# Patient Record
Sex: Female | Born: 1970 | Race: White | Hispanic: No | Marital: Married | Smoking: Former smoker
Health system: Southern US, Community
[De-identification: ages and names within clinical notes are randomized; demographics above are authoritative.]

## PROBLEM LIST (undated history)

## (undated) DIAGNOSIS — J45909 Unspecified asthma, uncomplicated: Secondary | ICD-10-CM

## (undated) DIAGNOSIS — L255 Unspecified contact dermatitis due to plants, except food: Secondary | ICD-10-CM

## (undated) DIAGNOSIS — J309 Allergic rhinitis, unspecified: Secondary | ICD-10-CM

## (undated) DIAGNOSIS — F32A Depression, unspecified: Secondary | ICD-10-CM

## (undated) DIAGNOSIS — G43909 Migraine, unspecified, not intractable, without status migrainosus: Secondary | ICD-10-CM

## (undated) DIAGNOSIS — R4589 Other symptoms and signs involving emotional state: Secondary | ICD-10-CM

## (undated) DIAGNOSIS — K219 Gastro-esophageal reflux disease without esophagitis: Secondary | ICD-10-CM

## (undated) DIAGNOSIS — J329 Chronic sinusitis, unspecified: Secondary | ICD-10-CM

## (undated) DIAGNOSIS — F329 Major depressive disorder, single episode, unspecified: Secondary | ICD-10-CM

## (undated) DIAGNOSIS — K3184 Gastroparesis: Secondary | ICD-10-CM

## (undated) DIAGNOSIS — K589 Irritable bowel syndrome without diarrhea: Secondary | ICD-10-CM

## (undated) DIAGNOSIS — R5383 Other fatigue: Secondary | ICD-10-CM

## (undated) DIAGNOSIS — D649 Anemia, unspecified: Secondary | ICD-10-CM

## (undated) DIAGNOSIS — Z87448 Personal history of other diseases of urinary system: Secondary | ICD-10-CM

## (undated) DIAGNOSIS — N390 Urinary tract infection, site not specified: Secondary | ICD-10-CM

## (undated) DIAGNOSIS — K5790 Diverticulosis of intestine, part unspecified, without perforation or abscess without bleeding: Secondary | ICD-10-CM

## (undated) DIAGNOSIS — K5900 Constipation, unspecified: Secondary | ICD-10-CM

## (undated) DIAGNOSIS — E669 Obesity, unspecified: Secondary | ICD-10-CM

## (undated) DIAGNOSIS — R5381 Other malaise: Secondary | ICD-10-CM

## (undated) DIAGNOSIS — B019 Varicella without complication: Secondary | ICD-10-CM

## (undated) DIAGNOSIS — E538 Deficiency of other specified B group vitamins: Secondary | ICD-10-CM

## (undated) HISTORY — DX: Unspecified contact dermatitis due to plants, except food: L25.5

## (undated) HISTORY — DX: Irritable bowel syndrome, unspecified: K58.9

## (undated) HISTORY — DX: Major depressive disorder, single episode, unspecified: F32.9

## (undated) HISTORY — DX: Anemia, unspecified: D64.9

## (undated) HISTORY — DX: Varicella without complication: B01.9

## (undated) HISTORY — DX: Constipation, unspecified: K59.00

## (undated) HISTORY — DX: Gastroparesis: K31.84

## (undated) HISTORY — DX: Other malaise: R53.81

## (undated) HISTORY — DX: Other fatigue: R53.83

## (undated) HISTORY — DX: Chronic sinusitis, unspecified: J32.9

## (undated) HISTORY — DX: Personal history of other diseases of urinary system: Z87.448

## (undated) HISTORY — DX: Other symptoms and signs involving emotional state: R45.89

## (undated) HISTORY — DX: Migraine, unspecified, not intractable, without status migrainosus: G43.909

## (undated) HISTORY — DX: Obesity, unspecified: E66.9

## (undated) HISTORY — DX: Urinary tract infection, site not specified: N39.0

## (undated) HISTORY — DX: Diverticulosis of intestine, part unspecified, without perforation or abscess without bleeding: K57.90

## (undated) HISTORY — DX: Depression, unspecified: F32.A

## (undated) HISTORY — DX: Gastro-esophageal reflux disease without esophagitis: K21.9

## (undated) HISTORY — DX: Deficiency of other specified B group vitamins: E53.8

## (undated) HISTORY — DX: Allergic rhinitis, unspecified: J30.9

## (undated) HISTORY — DX: Unspecified asthma, uncomplicated: J45.909

## (undated) HISTORY — PX: TONSILLECTOMY: SUR1361

---

## 1998-12-08 ENCOUNTER — Emergency Department (HOSPITAL_COMMUNITY): Admission: EM | Admit: 1998-12-08 | Discharge: 1998-12-08 | Payer: Self-pay | Admitting: Emergency Medicine

## 1999-05-17 ENCOUNTER — Other Ambulatory Visit: Admission: RE | Admit: 1999-05-17 | Discharge: 1999-05-17 | Payer: Self-pay | Admitting: Obstetrics and Gynecology

## 2000-01-13 ENCOUNTER — Inpatient Hospital Stay (HOSPITAL_COMMUNITY): Admission: AD | Admit: 2000-01-13 | Discharge: 2000-01-13 | Payer: Self-pay | Admitting: *Deleted

## 2000-01-21 ENCOUNTER — Inpatient Hospital Stay (HOSPITAL_COMMUNITY): Admission: AD | Admit: 2000-01-21 | Discharge: 2000-01-21 | Payer: Self-pay | Admitting: Obstetrics and Gynecology

## 2000-08-14 ENCOUNTER — Inpatient Hospital Stay (HOSPITAL_COMMUNITY): Admission: AD | Admit: 2000-08-14 | Discharge: 2000-08-16 | Payer: Self-pay | Admitting: Obstetrics and Gynecology

## 2000-09-19 ENCOUNTER — Other Ambulatory Visit: Admission: RE | Admit: 2000-09-19 | Discharge: 2000-09-19 | Payer: Self-pay | Admitting: Obstetrics and Gynecology

## 2001-09-24 ENCOUNTER — Other Ambulatory Visit: Admission: RE | Admit: 2001-09-24 | Discharge: 2001-09-24 | Payer: Self-pay | Admitting: Obstetrics and Gynecology

## 2002-10-17 ENCOUNTER — Other Ambulatory Visit: Admission: RE | Admit: 2002-10-17 | Discharge: 2002-10-17 | Payer: Self-pay | Admitting: Obstetrics and Gynecology

## 2003-06-06 HISTORY — PX: LUMBAR DISC SURGERY: SHX700

## 2003-07-01 ENCOUNTER — Ambulatory Visit (HOSPITAL_COMMUNITY): Admission: RE | Admit: 2003-07-01 | Discharge: 2003-07-02 | Payer: Self-pay | Admitting: Orthopedic Surgery

## 2003-11-04 ENCOUNTER — Other Ambulatory Visit: Admission: RE | Admit: 2003-11-04 | Discharge: 2003-11-04 | Payer: Self-pay | Admitting: Obstetrics and Gynecology

## 2005-02-04 ENCOUNTER — Encounter (INDEPENDENT_AMBULATORY_CARE_PROVIDER_SITE_OTHER): Payer: Self-pay | Admitting: Specialist

## 2005-02-04 ENCOUNTER — Ambulatory Visit (HOSPITAL_COMMUNITY): Admission: RE | Admit: 2005-02-04 | Discharge: 2005-02-04 | Payer: Self-pay | Admitting: Obstetrics and Gynecology

## 2005-04-12 ENCOUNTER — Ambulatory Visit: Payer: Self-pay | Admitting: Family Medicine

## 2006-03-14 ENCOUNTER — Ambulatory Visit: Payer: Self-pay | Admitting: Family Medicine

## 2006-07-26 ENCOUNTER — Ambulatory Visit: Payer: Self-pay | Admitting: Family Medicine

## 2006-09-08 ENCOUNTER — Ambulatory Visit: Payer: Self-pay | Admitting: Internal Medicine

## 2006-09-27 ENCOUNTER — Ambulatory Visit: Payer: Self-pay | Admitting: Family Medicine

## 2006-09-29 ENCOUNTER — Encounter: Admission: RE | Admit: 2006-09-29 | Discharge: 2006-09-29 | Payer: Self-pay | Admitting: Family Medicine

## 2008-01-31 ENCOUNTER — Ambulatory Visit: Payer: Self-pay | Admitting: Family Medicine

## 2008-01-31 DIAGNOSIS — L255 Unspecified contact dermatitis due to plants, except food: Secondary | ICD-10-CM | POA: Insufficient documentation

## 2008-02-13 ENCOUNTER — Ambulatory Visit: Payer: Self-pay | Admitting: Family Medicine

## 2008-02-13 DIAGNOSIS — J309 Allergic rhinitis, unspecified: Secondary | ICD-10-CM | POA: Insufficient documentation

## 2008-02-13 DIAGNOSIS — K589 Irritable bowel syndrome without diarrhea: Secondary | ICD-10-CM | POA: Insufficient documentation

## 2008-02-13 DIAGNOSIS — Z87448 Personal history of other diseases of urinary system: Secondary | ICD-10-CM | POA: Insufficient documentation

## 2008-02-13 DIAGNOSIS — J45909 Unspecified asthma, uncomplicated: Secondary | ICD-10-CM | POA: Insufficient documentation

## 2008-02-13 DIAGNOSIS — J329 Chronic sinusitis, unspecified: Secondary | ICD-10-CM | POA: Insufficient documentation

## 2008-05-26 ENCOUNTER — Encounter: Payer: Self-pay | Admitting: Family Medicine

## 2008-06-09 ENCOUNTER — Encounter: Payer: Self-pay | Admitting: Family Medicine

## 2008-06-10 ENCOUNTER — Encounter: Payer: Self-pay | Admitting: Family Medicine

## 2008-08-21 ENCOUNTER — Ambulatory Visit: Payer: Self-pay | Admitting: Family Medicine

## 2008-08-21 LAB — CONVERTED CEMR LAB: Rapid Strep: NEGATIVE

## 2008-09-30 ENCOUNTER — Encounter: Payer: Self-pay | Admitting: Family Medicine

## 2008-11-03 HISTORY — PX: INCONTINENCE SURGERY: SHX676

## 2008-11-05 ENCOUNTER — Encounter: Payer: Self-pay | Admitting: Family Medicine

## 2008-11-27 ENCOUNTER — Ambulatory Visit (HOSPITAL_BASED_OUTPATIENT_CLINIC_OR_DEPARTMENT_OTHER): Admission: RE | Admit: 2008-11-27 | Discharge: 2008-11-28 | Payer: Self-pay | Admitting: Urology

## 2009-05-07 ENCOUNTER — Encounter: Payer: Self-pay | Admitting: Family Medicine

## 2009-05-08 ENCOUNTER — Ambulatory Visit: Payer: Self-pay | Admitting: Family Medicine

## 2009-05-08 DIAGNOSIS — R5383 Other fatigue: Secondary | ICD-10-CM

## 2009-05-08 DIAGNOSIS — R5381 Other malaise: Secondary | ICD-10-CM | POA: Insufficient documentation

## 2009-05-08 DIAGNOSIS — K219 Gastro-esophageal reflux disease without esophagitis: Secondary | ICD-10-CM | POA: Insufficient documentation

## 2009-05-08 DIAGNOSIS — R4589 Other symptoms and signs involving emotional state: Secondary | ICD-10-CM | POA: Insufficient documentation

## 2009-05-12 LAB — CONVERTED CEMR LAB
ALT: 16 units/L (ref 0–35)
AST: 23 units/L (ref 0–37)
Albumin: 3.9 g/dL (ref 3.5–5.2)
Alkaline Phosphatase: 78 units/L (ref 39–117)
BUN: 10 mg/dL (ref 6–23)
Basophils Absolute: 0 10*3/uL (ref 0.0–0.1)
Basophils Relative: 0.2 % (ref 0.0–3.0)
Bilirubin, Direct: 0.1 mg/dL (ref 0.0–0.3)
CO2: 27 meq/L (ref 19–32)
Calcium: 8.9 mg/dL (ref 8.4–10.5)
Chloride: 107 meq/L (ref 96–112)
Cholesterol: 122 mg/dL (ref 0–200)
Creatinine, Ser: 0.7 mg/dL (ref 0.4–1.2)
Eosinophils Absolute: 0.2 10*3/uL (ref 0.0–0.7)
Eosinophils Relative: 2.9 % (ref 0.0–5.0)
Folate: 6.8 ng/mL
GFR calc non Af Amer: 98.57 mL/min (ref >60)
Glucose, Bld: 99 mg/dL (ref 70–99)
HCT: 37.2 % (ref 36.0–46.0)
HDL: 39.1 mg/dL (ref >39.00)
Hemoglobin: 12.5 g/dL (ref 12.0–15.0)
LDL Cholesterol: 71 mg/dL (ref 0–99)
Lymphocytes Relative: 21 % (ref 12.0–46.0)
Lymphs Abs: 1.4 10*3/uL (ref 0.7–4.0)
MCHC: 33.6 g/dL (ref 30.0–36.0)
MCV: 86.2 fL (ref 78.0–100.0)
Monocytes Absolute: 0.7 10*3/uL (ref 0.1–1.0)
Monocytes Relative: 10.8 % (ref 3.0–12.0)
Neutro Abs: 4.5 10*3/uL (ref 1.4–7.7)
Neutrophils Relative %: 65.1 % (ref 43.0–77.0)
Platelets: 310 10*3/uL (ref 150.0–400.0)
Potassium: 4 meq/L (ref 3.5–5.1)
Pro B Natriuretic peptide (BNP): 33 pg/mL (ref 0.0–100.0)
RBC: 4.32 M/uL (ref 3.87–5.11)
RDW: 13.4 % (ref 11.5–14.6)
Sodium: 140 meq/L (ref 135–145)
TSH: 1.04 microintl units/mL (ref 0.35–5.50)
Total Bilirubin: 0.8 mg/dL (ref 0.3–1.2)
Total CHOL/HDL Ratio: 3
Total Protein: 7 g/dL (ref 6.0–8.3)
Triglycerides: 58 mg/dL (ref 0.0–149.0)
VLDL: 11.6 mg/dL (ref 0.0–40.0)
Vitamin B-12: 185 pg/mL — ABNORMAL LOW (ref 211–911)
WBC: 6.8 10*3/uL (ref 4.5–10.5)

## 2009-05-15 ENCOUNTER — Ambulatory Visit: Payer: Self-pay | Admitting: Family Medicine

## 2009-05-15 DIAGNOSIS — E538 Deficiency of other specified B group vitamins: Secondary | ICD-10-CM | POA: Insufficient documentation

## 2009-05-22 ENCOUNTER — Ambulatory Visit: Payer: Self-pay | Admitting: Family Medicine

## 2009-05-29 ENCOUNTER — Ambulatory Visit: Payer: Self-pay | Admitting: Family Medicine

## 2009-06-05 ENCOUNTER — Ambulatory Visit: Payer: Self-pay | Admitting: Family Medicine

## 2009-06-25 ENCOUNTER — Ambulatory Visit: Payer: Self-pay | Admitting: Family Medicine

## 2009-06-25 ENCOUNTER — Telehealth: Payer: Self-pay | Admitting: Family Medicine

## 2009-07-01 ENCOUNTER — Telehealth: Payer: Self-pay | Admitting: Family Medicine

## 2009-07-01 ENCOUNTER — Ambulatory Visit: Payer: Self-pay | Admitting: Family Medicine

## 2009-07-01 DIAGNOSIS — N390 Urinary tract infection, site not specified: Secondary | ICD-10-CM | POA: Insufficient documentation

## 2009-07-01 LAB — CONVERTED CEMR LAB
Bilirubin Urine: NEGATIVE
Glucose, Urine, Semiquant: NEGATIVE
Ketones, urine, test strip: NEGATIVE
Nitrite: NEGATIVE
RBC / HPF: 0
Specific Gravity, Urine: 1.01
Urobilinogen, UA: 0.2
Vitamin B-12: 396 pg/mL (ref 211–911)
pH: 6.5

## 2009-07-10 ENCOUNTER — Ambulatory Visit: Payer: Self-pay | Admitting: Family Medicine

## 2009-08-07 ENCOUNTER — Ambulatory Visit: Payer: Self-pay | Admitting: Family Medicine

## 2009-09-02 ENCOUNTER — Ambulatory Visit: Payer: Self-pay | Admitting: Gastroenterology

## 2009-09-03 ENCOUNTER — Ambulatory Visit: Payer: Self-pay | Admitting: Gastroenterology

## 2009-09-11 ENCOUNTER — Ambulatory Visit: Payer: Self-pay | Admitting: Family Medicine

## 2009-10-02 ENCOUNTER — Ambulatory Visit: Payer: Self-pay | Admitting: Family Medicine

## 2009-10-03 LAB — CONVERTED CEMR LAB: Vitamin B-12: 677 pg/mL (ref 211–911)

## 2009-10-09 ENCOUNTER — Ambulatory Visit: Payer: Self-pay | Admitting: Family Medicine

## 2009-10-27 ENCOUNTER — Ambulatory Visit: Payer: Self-pay | Admitting: Gastroenterology

## 2009-11-06 ENCOUNTER — Ambulatory Visit: Payer: Self-pay | Admitting: Family Medicine

## 2009-11-09 ENCOUNTER — Ambulatory Visit (HOSPITAL_COMMUNITY): Admission: RE | Admit: 2009-11-09 | Discharge: 2009-11-09 | Payer: Self-pay | Admitting: Gastroenterology

## 2009-11-10 ENCOUNTER — Ambulatory Visit: Payer: Self-pay | Admitting: Family Medicine

## 2009-11-11 ENCOUNTER — Telehealth (INDEPENDENT_AMBULATORY_CARE_PROVIDER_SITE_OTHER): Payer: Self-pay | Admitting: *Deleted

## 2009-11-11 ENCOUNTER — Encounter: Payer: Self-pay | Admitting: Gastroenterology

## 2009-11-25 ENCOUNTER — Ambulatory Visit: Payer: Self-pay | Admitting: Gastroenterology

## 2009-11-25 DIAGNOSIS — K3184 Gastroparesis: Secondary | ICD-10-CM | POA: Insufficient documentation

## 2009-12-07 ENCOUNTER — Telehealth: Payer: Self-pay | Admitting: Gastroenterology

## 2009-12-08 ENCOUNTER — Telehealth (INDEPENDENT_AMBULATORY_CARE_PROVIDER_SITE_OTHER): Payer: Self-pay | Admitting: *Deleted

## 2009-12-11 ENCOUNTER — Ambulatory Visit: Payer: Self-pay | Admitting: Family Medicine

## 2009-12-14 ENCOUNTER — Encounter: Payer: Self-pay | Admitting: Gastroenterology

## 2010-01-04 ENCOUNTER — Encounter: Admission: RE | Admit: 2010-01-04 | Discharge: 2010-01-04 | Payer: Self-pay | Admitting: Obstetrics and Gynecology

## 2010-01-06 ENCOUNTER — Ambulatory Visit: Payer: Self-pay | Admitting: Gastroenterology

## 2010-01-07 ENCOUNTER — Telehealth: Payer: Self-pay | Admitting: Gastroenterology

## 2010-01-08 ENCOUNTER — Ambulatory Visit: Payer: Self-pay | Admitting: Family Medicine

## 2010-02-25 ENCOUNTER — Ambulatory Visit: Payer: Self-pay | Admitting: Family Medicine

## 2010-03-01 ENCOUNTER — Ambulatory Visit: Payer: Self-pay | Admitting: Gastroenterology

## 2010-03-12 ENCOUNTER — Encounter (INDEPENDENT_AMBULATORY_CARE_PROVIDER_SITE_OTHER): Payer: Self-pay | Admitting: *Deleted

## 2010-03-26 ENCOUNTER — Ambulatory Visit: Payer: Self-pay | Admitting: Family Medicine

## 2010-04-30 ENCOUNTER — Ambulatory Visit: Payer: Self-pay | Admitting: Family Medicine

## 2010-06-07 ENCOUNTER — Ambulatory Visit: Payer: Self-pay | Admitting: Gastroenterology

## 2010-06-07 DIAGNOSIS — K5900 Constipation, unspecified: Secondary | ICD-10-CM | POA: Insufficient documentation

## 2010-09-26 ENCOUNTER — Encounter: Payer: Self-pay | Admitting: Family Medicine

## 2010-10-05 NOTE — Assessment & Plan Note (Signed)
Summary: TOWER B12/RBH   Nurse Visit   Allergies: 1)  ! * Aleve  Medication Administration  Injection # 1:    Medication: Vit B12 1000 mcg    Diagnosis: VITAMIN B12 DEFICIENCY (ICD-266.2)    Route: IM    Site: L deltoid    Exp Date: 06/2011    Lot #: [id number]    Mfr: American Regent    Patient tolerated injection without complications    Given by: Lowella Petties CMA (September 11, 2009 9:31 AM)  Orders Added: 1)  Vit B12 1000 mcg [J3420] 2)  Admin of Therapeutic Inj  intramuscular or subcutaneous [[id number]]   Medication Administration  Injection # 1:    Medication: Vit B12 1000 mcg    Diagnosis: VITAMIN B12 DEFICIENCY (ICD-266.2)    Route: IM    Site: L deltoid    Exp Date: 06/2011    Lot #: [id number]    Mfr: American Regent    Patient tolerated injection without complications    Given by: Lowella Petties CMA (September 11, 2009 9:31 AM)  Orders Added: 1)  Vit B12 1000 mcg [J3420] 2)  Admin of Therapeutic Inj  intramuscular or subcutaneous [[id number]]

## 2010-10-05 NOTE — Assessment & Plan Note (Signed)
Summary: EGD F/U.Marland KitchenMarland KitchenAS.    History of Present Illness Visit Type: Follow-up Visit Primary GI MD: Melvia Heaps MD [facility] Primary Provider: Roxy Manns, MD Requesting Provider: n/a Chief Complaint: F/u from EGD History of Present Illness:   Holly Chung has returned for followup of her GI complaints.  Upper endoscopy in December, 2010 was unremarkable.  She takes Dexilant daily.  Her main complaint is postprandial fullness with excess eructations  and a sensation of globus.  This usually occurs within half a hour of eating and may last up to an hour.  She no longer has an acid-like sensation. She drinks a large amount of water throughout the day and during meals.  She has the sensation of wanting to clear her throat.   GI Review of Systems      Denies abdominal pain, acid reflux, belching, bloating, chest pain, dysphagia with liquids, dysphagia with solids, heartburn, loss of appetite, nausea, vomiting, vomiting blood, weight loss, and  weight gain.        Denies anal fissure, black tarry stools, change in bowel habit, constipation, diarrhea, diverticulosis, fecal incontinence, heme positive stool, hemorrhoids, irritable bowel syndrome, jaundice, light color stool, liver problems, rectal bleeding, and  rectal pain.    Current Medications (verified): 1)  Dexilant 60 Mg Cpdr (Dexlansoprazole) .Marland Kitchen.. 1 By Mouth Once Daily 2)  Alprazolam 0.5 Mg Tabs (Alprazolam) .Marland Kitchen.. 1 By Mouth Once Daily As Needed Severe Anxiety Caution- It Does Sedate 3)  Vitamin B Complex-C   Caps (B Complex-C) .... Otc As Directed. 4)  Vitamin B-12 500 Mcg  Tabs (Cyanocobalamin) .... Take Two Tabs Daily 5)  Cyanocobalamin 1000 Mcg/ml Soln (Cyanocobalamin) .... Monthly Injectiions  Allergies (verified): 1)  ! * Aleve  Past History:  Past Medical History: Reviewed history from 09/02/2009 and no changes required. IBS reactive airways recurrent sinusitis  obesity GERD Anemia Depression Diverticulosis  Past Surgical  History: Reviewed history from 09/02/2009 and no changes required. Discectomy L5, S1 (06/2003) 3/10 bladder sling/incontinence surgery urol-- Tannenbaum Tonsillectomy  Family History: Father: emphysema Mother:  Siblings: brother died age 86- brain tumor, brother died age 7- leukemia MGM with heart probs Family History of Heart Disease: Father Family History of Colon Cancer:Maternal Aunt   Social History: Reviewed history from 08/21/2008 and no changes required. Marital Status: Married Children: 3 Occupation: [employer] Former Smoker, > 10 years  Review of Systems  The patient denies allergy/sinus, anemia, anxiety-new, arthritis/joint pain, back pain, blood in urine, breast changes/lumps, change in vision, confusion, cough, coughing up blood, depression-new, fainting, fatigue, fever, headaches-new, hearing problems, heart murmur, heart rhythm changes, itching, menstrual pain, muscle pains/cramps, night sweats, nosebleeds, pregnancy symptoms, shortness of breath, skin rash, sleeping problems, sore throat, swelling of feet/legs, swollen lymph glands, thirst - excessive , urination - excessive , urination changes/pain, urine leakage, vision changes, and voice change.    Vital Signs:  Patient profile:   40 year old female Height:      67.5 inches Weight:      247 pounds BMI:     38.25 BSA:     2.23 Pulse rate:   68 / minute Pulse rhythm:   regular BP sitting:   124 / 80  (left arm) Cuff size:   regular  Vitals Entered By: Ok Anis CMA (October 27, 2009 1:53 PM)   Impression & Recommendations:  Problem # 1:  ESOPHAGEAL REFLUX (ICD-530.81)  At this point her symptoms may be more dyspeptic than do to acid reflux, per se.  Gastroparesis  is a possibility.  It is noteworthy that she drinks a large amount of fluids throughout the day and during a meal and may be experiencing aerophagia.  Recommendations #1 gastric emptying scan #2 continue DEXILANT #3 patient was instructed to  separate her eating from drinking  Other Orders: Gastric Emptying Scan (GES)  Patient Instructions: 1)  Your GES scan is scheduled for 11/09/2009 at 10am at [facility] radiology 2)  Continue Dexilant 3)  Return to follow up in 3-4 weeks 4)  The medication list was reviewed and reconciled.  All changed / newly prescribed medications were explained.  A complete medication list was provided to the patient / caregiver.

## 2010-10-05 NOTE — Assessment & Plan Note (Signed)
Summary: TOWER B12/RBH   Nurse Visit   Allergies: 1)  ! * Aleve  Medication Administration  Injection # 1:    Medication: Vit B12 1000 mcg    Diagnosis: VITAMIN B12 DEFICIENCY (ICD-266.2)    Route: IM    Site: L deltoid    Exp Date: 12/05/2010    Lot #: [id number]    Mfr: American Regent    Patient tolerated injection without complications    Given by: Mervin Hack CMA (AAMA) (June 05, 2009 8:49 AM)  Orders Added: 1)  Vit B12 1000 mcg [J3420] 2)  Admin of Therapeutic Inj  intramuscular or subcutaneous [[id number]]   Medication Administration  Injection # 1:    Medication: Vit B12 1000 mcg    Diagnosis: VITAMIN B12 DEFICIENCY (ICD-266.2)    Route: IM    Site: L deltoid    Exp Date: 12/05/2010    Lot #: [id number]    Mfr: American Regent    Patient tolerated injection without complications    Given by: Mervin Hack CMA (AAMA) (June 05, 2009 8:49 AM)  Orders Added: 1)  Vit B12 1000 mcg [J3420] 2)  Admin of Therapeutic Inj  intramuscular or subcutaneous [[id number]]

## 2010-10-05 NOTE — Assessment & Plan Note (Signed)
Summary: F/U APPT...LSW.    History of Present Illness Visit Type: Follow-up Visit Primary GI MD: Melvia Heaps MD [facility] Primary Provider: Roxy Manns, MD Requesting Provider: n/a Chief Complaint: Pt c/o when she swallows food is not going down  History of Present Illness:   Holly Chung has returned for followup of her dyspepsia.  Gastric empty scan demonstrated moderate delayed gastric emptying.  On Reglan symptoms have improved.  There days that she is symptom-free.  At other times she Swartz has some bloating.   GI Review of Systems      Denies abdominal pain, acid reflux, belching, bloating, chest pain, dysphagia with liquids, dysphagia with solids, heartburn, loss of appetite, nausea, vomiting, vomiting blood, weight loss, and  weight gain.        Denies anal fissure, black tarry stools, change in bowel habit, constipation, diarrhea, diverticulosis, fecal incontinence, heme positive stool, hemorrhoids, irritable bowel syndrome, jaundice, light color stool, liver problems, rectal bleeding, and  rectal pain.    Current Medications (verified): 1)  Dexilant 60 Mg Cpdr (Dexlansoprazole) .Marland Kitchen.. 1 By Mouth Once Daily 2)  Alprazolam 0.5 Mg Tabs (Alprazolam) .Marland Kitchen.. 1 By Mouth Once Daily As Needed Severe Anxiety Caution- It Does Sedate 3)  Vitamin B Complex-C   Caps (B Complex-C) .... Otc As Directed. 4)  Vitamin B-12 500 Mcg  Tabs (Cyanocobalamin) .... Take Two Tabs Daily 5)  Cyanocobalamin 1000 Mcg/ml Soln (Cyanocobalamin) .... Monthly Injectiions 6)  Reglan 10 Mg Tabs (Metoclopramide Hcl) .... Take 1 P.o. One Half Hr. A.c and Hs  Allergies (verified): 1)  ! * Aleve  Past History:  Past Medical History: Reviewed history from 09/02/2009 and no changes required. IBS reactive airways recurrent sinusitis  obesity GERD Anemia Depression Diverticulosis  Past Surgical History: Reviewed history from 09/02/2009 and no changes required. Discectomy L5, S1 (06/2003) 3/10 bladder  sling/incontinence surgery urol-- Tannenbaum Tonsillectomy  Family History: Reviewed history from 10/27/2009 and no changes required. Father: emphysema Mother:  Siblings: brother died age 78- brain tumor, brother died age 42- leukemia MGM with heart probs Family History of Heart Disease: Father Family History of Colon Cancer:Maternal Aunt   Social History: Reviewed history from 08/21/2008 and no changes required. Marital Status: Married Children: 3 Occupation: [employer] Former Smoker, > 10 years  Review of Systems  The patient denies allergy/sinus, anemia, anxiety-new, arthritis/joint pain, back pain, blood in urine, breast changes/lumps, change in vision, confusion, cough, coughing up blood, depression-new, fainting, fatigue, fever, headaches-new, hearing problems, heart murmur, heart rhythm changes, itching, menstrual pain, muscle pains/cramps, night sweats, nosebleeds, pregnancy symptoms, shortness of breath, skin rash, sleeping problems, sore throat, swelling of feet/legs, swollen lymph glands, thirst - excessive , urination - excessive , urination changes/pain, urine leakage, vision changes, and voice change.    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  This no doubt is contributing to her GERD.  Recommendations #1 continue DEXILANT #2 continue Reglan.  Patient was warned of side  effects and instructed to immediately contact me if any neurologic problems appear.  Problem # 2:  ESOPHAGEAL REFLUX (ICD-530.81) See assessment #1  Patient Instructions: 1)  Gastroparesis  brochure given.  2)  The medication list was reviewed and reconciled.  All changed / newly prescribed medications were explained.  A complete medication list was provided to the patient / caregiver.

## 2010-10-05 NOTE — Assessment & Plan Note (Signed)
Summary: CONGESTION,RIGHT EAR/CLE   Vital Signs:  Patient profile:   40 year old female Weight:      251.75 pounds Temp:     98.5 degrees F oral Pulse rate:   76 / minute Pulse rhythm:   regular BP sitting:   112 / 78  (left arm) Cuff size:   large  Vitals Entered By: Sydell Axon LPN (November 11, 2723 10:38 AM) CC: Head congestion and right ear hurts   History of Present Illness: Holly Chung is a 40 y/o caucasian female who presents today with a 3 day history of congestion with the onset of R ear pain last night.  She stated that she felt as if she had "water in her ears," her hearing has been muffled on the R side, headaches, AM sore throat, and congestion for which she has only been able to produce brown sputum in the AM.  Denies any fever or cough.  She has taken Equate allergy relief which has helped her symptoms modestly.  Allergies: 1)  ! * Aleve  Physical Exam  General:  Well-developed,well-nourished,in no acute distress; alert,appropriate and cooperative throughout examination Head:  Normocephalic and atraumatic without obvious abnormalities. No apparent alopecia or balding.  Some maxillary tenderness to palpation. Eyes:  Conjunctiva clear bilat. Ears:  External ear exam shows no significant lesions or deformities.  Otoscopic examination reveals clear canals, tympanic membranes are intact bilaterally without bulging, retraction, inflammation or discharge. Hearing is grossly normal bilaterally. TMs dull bilat. Nose:  External nasal examination shows no deformity or inflammation. Nasal mucosa are pink and moist without lesions or exudates. Mouth:  Oral mucosa and oropharynx without lesions or exudates.  Teeth in good repair. Neck:  No deformities, masses, or tenderness noted. Lungs:  Normal respiratory effort, chest expands symmetrically. Lungs are clear to auscultation, no crackles or wheezes. Heart:  Normal rate and regular rhythm. S1 and S2 normal without gallop, murmur, click,  rub or other extra sounds.   Impression & Recommendations:  Problem # 1:  URI (ICD-465.9) Assessment New  See instructions.  Instructed on symptomatic treatment. Call if symptoms persist or worsen.   Problem # 2:  DYSFUNCTION OF EUSTACHIAN TUBE (ICD-381.81) Assessment: New Discussed. See instructions.  Complete Medication List: 1)  Dexilant 60 Mg Cpdr (Dexlansoprazole) .Marland Kitchen.. 1 by mouth once daily 2)  Alprazolam 0.5 Mg Tabs (Alprazolam) .Marland Kitchen.. 1 by mouth once daily as needed severe anxiety caution- it does sedate 3)  Vitamin B Complex-c Caps (B complex-c) .... Otc as directed. 4)  Vitamin B-12 500 Mcg Tabs (Cyanocobalamin) .... Take two tabs daily 5)  Cyanocobalamin 1000 Mcg/ml Soln (Cyanocobalamin) .... Monthly injectiions  Patient Instructions: 1)  Take Guaifenesin by [facility] or [facility] and getting MUCOUS RELIEF EXPECTORANT (400mg ), take 11/2 tabs by mouth AM and NOON. 2)  Drink lots of fluids anytime taking Guaifenesin.  3)  Take IBP 200mg  x4 after each meal. 4)  Gargle with warm salt water every 1/2 hr  5)  Keep a lozenge in your mouth for a week or more. 6)  Call if sxs worsen.  Current Allergies (reviewed today): ! * ALEVE

## 2010-10-05 NOTE — Assessment & Plan Note (Signed)
Summary: gerd...as.    History of Present Illness Visit Type: Initial Visit Primary GI MD: Melvia Heaps MD [facility] Primary Provider: Roxy Manns, MD Chief Complaint: GERD History of Present Illness:   Ms. Holly Chung is a pleasant 40 year old white female referred at the request of Dr. Milinda Antis for evaluation of pyrosis and chest discomfort.  She has frequent pyrosis and fullness in her chest.  She also complains of belching bloating and nausea.  Pyrosis has improved since taking Prilosec but the fullness has not subsided.  She denies dysphagia.  She does take Motrin irregularly.  Weight has been stable.   GI Review of Systems    Reports acid reflux, belching, bloating, chest pain, heartburn, and  nausea.      Denies abdominal pain, dysphagia with liquids, dysphagia with solids, loss of appetite, vomiting, vomiting blood, weight loss, and  weight gain.      Reports constipation and  diverticulosis.     Denies anal fissure, black tarry stools, change in bowel habit, diarrhea, fecal incontinence, heme positive stool, hemorrhoids, irritable bowel syndrome, jaundice, light color stool, liver problems, rectal bleeding, and  rectal pain.    Current Medications (verified): 1)  Prilosec 20 Mg Cpdr (Omeprazole) .Marland Kitchen.. 1 By Mouth Once Daily in Am 2)  Alprazolam 0.5 Mg Tabs (Alprazolam) .Marland Kitchen.. 1 By Mouth Once Daily As Needed Severe Anxiety Caution- It Does Sedate 3)  Vitamin B Complex-C   Caps (B Complex-C) .... Otc As Directed. 4)  Vitamin B-12 500 Mcg  Tabs (Cyanocobalamin) .... Take Two Tabs Daily 5)  Cyanocobalamin 1000 Mcg/ml Soln (Cyanocobalamin) .... Monthly Injectiions  Allergies (verified): 1)  ! * Aleve  Past History:  Past Medical History: IBS reactive airways recurrent sinusitis  obesity GERD Anemia Depression Diverticulosis  Past Surgical History: Discectomy L5, S1 (06/2003) 3/10 bladder sling/incontinence surgery urol-- Tannenbaum Tonsillectomy  Family History: Father:  emphysema Mother:  Siblings: brother died age 15- brain tumor, brother died age 42- leukemia MGM with heart probs Family History of Heart Disease: Father  Review of Systems       The patient complains of anxiety-new, fatigue, headaches-new, and muscle pains/cramps.         All other systems were reviewed and were negative'   Vital Signs:  Patient profile:   40 year old female Height:      67.5 inches Weight:      254 pounds BMI:     39.34 Pulse rate:   64 / minute Pulse rhythm:   regular BP sitting:   120 / 78  (left arm) Cuff size:   large  Vitals Entered By: June McMurray CMA Duncan Dull) (September 02, 2009 9:59 AM)  Physical Exam  Additional Exam:  She is a healthy-appearing female  skin: anicteric HEENT: normocephalic; PEERLA; no nasal or pharyngeal abnormalities neck: supple nodes: no cervical lymphadenopathy chest: clear to ausculatation and percussion heart: no murmurs, gallops, or rubs abd: soft, nontender; BS normoactive; no abdominal masses, tenderness, organomegaly rectal: deferred ext: no cynanosis, clubbing, edema skeletal: no deformities neuro: oriented x 3; no focal abnormalities    Impression & Recommendations:  Problem # 1:  ESOPHAGEAL REFLUX (ICD-530.81)  Patient remains symptomatic despite therapy with Prilosec.  Recommendations #1 switch to [facility] #2 upper endoscopy  Risks, alternatives, and complications of the procedure, including bleeding, perforation, and possible need for surgery, were explained to the patient.  Patient's questions were answered.  Orders: EGD (EGD)  Patient Instructions: 1)  Conscious Sedation brochure given.  2)  GI Reflux  brochure given.  3)  Upper Endoscopy brochure given.  4)  We are giving you Dexilant samples today 5)  Your EGD is scheduled for 09/03/2009 at 4pm 6)  The medication list was reviewed and reconciled.  All changed / newly prescribed medications were explained.  A complete medication list was  provided to the patient / caregiver.

## 2010-10-05 NOTE — Progress Notes (Signed)
Summary: Needs Prilosec called in to pharmacy  Medications Added OMEPRAZOLE 20 MG CPDR (OMEPRAZOLE) 1 by mouth once daily       Phone Note Call from Patient Call back at Work Phone [phone]   Call For: Dr Arlyce Dice Summary of Call: At the office visit yesterday was told we would call in generic Prilosec to [facility] on [address]. but when she went to pharmacy they did not have it. Initial call taken by: Leanor Kail [facility],  Jan 07, 2010 12:26 PM    New/Updated Medications: OMEPRAZOLE 20 MG CPDR (OMEPRAZOLE) 1 by mouth once daily Prescriptions: OMEPRAZOLE 20 MG CPDR (OMEPRAZOLE) 1 by mouth once daily  #30 x 3   Entered by:   Merri Ray CMA (AAMA)   Authorized by:   Louis Meckel MD   Signed by:   Merri Ray CMA (AAMA) on 01/07/2010   Method used:   Electronically to        [facility] [address] [phone]* (retail)       [address]       [place], [place]  [address]       Ph: [phone] or [phone]       Fax: [phone]   RxID:   [id number]

## 2010-10-05 NOTE — Consult Note (Signed)
Summary: [facility] / DR. PAUL BEDNARZ   [facility] / DR. PAUL EAVWUJW   Imported By: Carin Primrose 05/28/2008 10:49:57  _____________________________________________________________________  External Attachment:    Type:   Image     Comment:   External Document

## 2010-10-05 NOTE — Assessment & Plan Note (Signed)
Summary: ST/CLE   Vital Signs:  Patient Profile:   40 Years Old Female Weight:      230 pounds Temp:     98.6 degrees F oral Pulse rate:   72 / minute Pulse rhythm:   regular BP sitting:   120 / 78  (left arm) Cuff size:   large  Vitals Entered By: Lowella Petties (February 13, 2008 9:58 AM)                 Chief Complaint:  Sore throat and laryngitis.  History of Present Illness: had a st for 10 days started loosing voice since sunday cough just started- in ams some yellow d/c this am now some nasal congestion with thick yellow goo no headache today- no facial pain  now throat is scratchy -- less than in beginning   on phentiramine for wt loss from her gyn-- lost 8 lb and bp has been fine    Current Allergies: ! * ALEVE     Review of Systems  General      Complains of fatigue.      Denies chills and fever.  Eyes      Complains of eye irritation and red eye.      used allergy drops this weekend   ENT      Complains of ear discharge and earache.      some ear pressure and drainage   CV      Denies chest pain or discomfort.  Resp      Complains of pleuritic and sputum productive.      Denies shortness of breath.      chest feels heavy and sore   GI      Denies diarrhea, nausea, and vomiting.  Derm      Complains of itching and rash.      poison ivy better- done with prednisone   Physical Exam  General:     overweight but generally well appearing  Head:     normocephalic, atraumatic, and no abnormalities observed.  no sinus tenderness  Eyes:     vision grossly intact, pupils equal, pupils round, pupils reactive to light, and no injection.   Ears:     R ear normal and L ear normal.   Nose:     mucosal erythema and mucosal edema.   Mouth:     pharynx pink and moist, no erythema, no exudates, and postnasal drip.   Neck:     No deformities, masses, or tenderness noted. Lungs:     CTA with harsh/coarse bs at bases no crackes or rales scant  rhonci slt wheeze on forced exp only without prolonged exp phase Skin:     Intact without suspicious lesions or rashes Cervical Nodes:     No lymphadenopathy noted Psych:     normal affect, talkative and pleasant     Impression & Recommendations:  Problem # 1:  BRONCHITIS-ACUTE (ICD-466.0) will tx symptoms with expectorant, cough supp, fluids, nasal saline z pak as directed pt advised to update me if symptoms worsen or do not improve - or if sob or worse st Her updated medication list for this problem includes:    Zithromax Z-pak 250 Mg Tabs (Azithromycin) .Marland Kitchen... Take by mouth as directed    Guaifenesin-codeine 100-10 Mg/78ml Soln (Guaifenesin-codeine) .Marland Kitchen... 1-2 tsp by mouth at bedtime as needed cough    Ventolin Hfa 108 (90 Base) Mcg/act Aers (Albuterol sulfate) .Marland Kitchen... 2 puffs q 4 hours as needed tight chest/wheeze  Complete Medication List: 1)  Phentermine Hcl 37.5 Mg Caps (Phentermine hcl) .... Take one by mouth daily 2)  Zithromax Z-pak 250 Mg Tabs (Azithromycin) .... Take by mouth as directed 3)  Guaifenesin-codeine 100-10 Mg/33ml Soln (Guaifenesin-codeine) .Marland Kitchen.. 1-2 tsp by mouth at bedtime as needed cough 4)  Ventolin Hfa 108 (90 Base) Mcg/act Aers (Albuterol sulfate) .... 2 puffs q 4 hours as needed tight chest/wheeze   Patient Instructions: 1)  you can try mucinex over the counter twice daily as directed and nasal saline spray for congestion 2)  tylenol over the counter as directed may help with aches, headache and fever 3)  call if symptoms worsen or if not improved in 4-5 days 4)  take the zithromax as directed  5)  use codine cough syrup with caution because it may sedate 6)  inhaler- as needed for wheezing    Prescriptions: VENTOLIN HFA 108 (90 BASE) MCG/ACT  AERS (ALBUTEROL SULFATE) 2 puffs q 4 hours as needed tight chest/wheeze  #1 mdi x 0   Entered and Authorized by:   Judith Part MD   Signed by:   Judith Part MD on 02/13/2008   Method used:   Print then  Give to Patient   RxID:   [id number] GUAIFENESIN-CODEINE 100-10 MG/5ML  SOLN (GUAIFENESIN-CODEINE) 1-2 tsp by mouth at bedtime as needed cough  #120cc x 0   Entered and Authorized by:   Judith Part MD   Signed by:   Judith Part MD on 02/13/2008   Method used:   Print then Give to Patient   RxID:   [id number] ZITHROMAX Z-PAK 250 MG  TABS (AZITHROMYCIN) take by mouth as directed  #1 pack x 0   Entered and Authorized by:   Judith Part MD   Signed by:   Judith Part MD on 02/13/2008   Method used:   Print then Give to Patient   RxID:   [id number]  ] Prior Medications: PHENTERMINE HCL 37.5 MG  CAPS (PHENTERMINE HCL) take one by mouth daily Current Allergies: ! * ALEVE

## 2010-10-05 NOTE — Assessment & Plan Note (Signed)
Summary: 40-MONTH F/U APPT...LSW.    History of Present Illness Visit Type: Follow-up Visit Primary GI MD: Melvia Heaps MD [facility] Primary Provider: Roxy Manns, MD Requesting Provider: n/a Chief Complaint: One month f/u for trouble swallowing food. Pt states that she feels better. Pt c/o feeling like she has to have a BM but cant  History of Present Illness:   Ms. Melick has returned for followup of her bloating and pyrosis.  Gastric emptying scan demonstrated significant delay.  On Reglan her symptoms have improved.  She complains of constipation and, with constipation, worsening bloating and is relieved with a bowel movement.  She may move her bowels only twice a week.   GI Review of Systems      Denies abdominal pain, acid reflux, belching, bloating, chest pain, dysphagia with liquids, dysphagia with solids, heartburn, loss of appetite, nausea, vomiting, vomiting blood, weight loss, and  weight gain.        Denies anal fissure, black tarry stools, change in bowel habit, constipation, diarrhea, diverticulosis, fecal incontinence, heme positive stool, hemorrhoids, irritable bowel syndrome, jaundice, light color stool, liver problems, rectal bleeding, and  rectal pain.    Current Medications (verified): 1)  Dexilant 60 Mg Cpdr (Dexlansoprazole) .Marland Kitchen.. 1 By Mouth Once Daily 2)  Alprazolam 0.5 Mg Tabs (Alprazolam) .Marland Kitchen.. 1 By Mouth Once Daily As Needed Severe Anxiety Caution- It Does Sedate 3)  Vitamin B Complex-C   Caps (B Complex-C) .... Otc As Directed. 4)  Vitamin B-12 500 Mcg  Tabs (Cyanocobalamin) .... Take Two Tabs Daily 5)  Cyanocobalamin 1000 Mcg/ml Soln (Cyanocobalamin) .... Monthly Injectiions 6)  Reglan 10 Mg Tabs (Metoclopramide Hcl) .... Take 1 P.o. One Half Hr. A.c and Hs  Allergies (verified): 1)  ! * Aleve  Past History:  Past Medical History: Reviewed history from 09/02/2009 and no changes required. IBS reactive airways recurrent sinusitis   obesity GERD Anemia Depression Diverticulosis  Past Surgical History: Reviewed history from 09/02/2009 and no changes required. Discectomy L5, S1 (06/2003) 3/10 bladder sling/incontinence surgery urol-- Tannenbaum Tonsillectomy  Family History: Reviewed history from 10/27/2009 and no changes required. Father: emphysema Mother:  Siblings: brother died age 2- brain tumor, brother died age 84- leukemia MGM with heart probs Family History of Heart Disease: Father Family History of Colon Cancer:Maternal Aunt   Social History: Reviewed history from 08/21/2008 and no changes required. Marital Status: Married Children: 3 Occupation: [employer] Former Smoker, > 10 years  Review of Systems  The patient denies allergy/sinus, anemia, anxiety-new, arthritis/joint pain, back pain, blood in urine, breast changes/lumps, change in vision, confusion, cough, coughing up blood, depression-new, fainting, fatigue, fever, headaches-new, hearing problems, heart murmur, heart rhythm changes, itching, menstrual pain, muscle pains/cramps, night sweats, nosebleeds, pregnancy symptoms, shortness of breath, skin rash, sleeping problems, sore throat, swelling of feet/legs, swollen lymph glands, thirst - excessive , urination - excessive , urination changes/pain, urine leakage, vision changes, and voice change.    Vital Signs:  Patient profile:   40 year old female Height:      67.5 inches Weight:      240 pounds BMI:     37.17 BSA:     2.20 Pulse rate:   64 / minute Pulse rhythm:   regular BP sitting:   124 / 82  (left arm) Cuff size:   regular  Vitals Entered By: Ok Anis CMA (Jan 06, 2010 8:44 AM)   Impression & Recommendations:  Problem # 1:  GASTROPARESIS (ICD-536.3) Assessment Improved Plan to continue  Reglan for 2-3 months at which point I will reevaluate.  Patient was instructed to contact me immediately  if she develops any side effects from her Reglan including paresthesias,  tremors, confusion , weakness or muscle spasms.  Problem # 2:  ESOPHAGEAL REFLUX (ICD-530.81) Symptoms are improved with the addition of Reglan.  She will try switching to Prilosec.  Problem # 3:  IBS (ICD-564.1) She generally has constipation.  Patient was instructed to take MiraLax if no bowel movement after 2-3 days.  Patient Instructions: 1)  Copy sent to : Marne Tower,MD 2)  You will need to schedule a follow up appointment in 6 weeks 3)  The medication list was reviewed and reconciled.  All changed / newly prescribed medications were explained.  A complete medication list was provided to the patient / caregiver.

## 2010-10-05 NOTE — Assessment & Plan Note (Signed)
Summary: 8:15 TOWER B12/RBH   Nurse Visit   Allergies: 1)  ! * Aleve  Medication Administration  Injection # 1:    Medication: Vit B12 1000 mcg    Diagnosis: VITAMIN B12 DEFICIENCY (ICD-266.2)    Route: IM    Site: L deltoid    Exp Date: 12/05/2011    Lot #: [id number]    Mfr: American Regent    Patient tolerated injection without complications    Given by: Mervin Hack CMA Duncan Dull) (April 30, 2010 8:45 AM)  Orders Added: 1)  Vit B12 1000 mcg [J3420] 2)  Admin of Therapeutic Inj  intramuscular or subcutaneous [96372]   Medication Administration  Injection # 1:    Medication: Vit B12 1000 mcg    Diagnosis: VITAMIN B12 DEFICIENCY (ICD-266.2)    Route: IM    Site: L deltoid    Exp Date: 12/05/2011    Lot #: [id number]    Mfr: American Regent    Patient tolerated injection without complications    Given by: Mervin Hack CMA (AAMA) (April 30, 2010 8:45 AM)  Orders Added: 1)  Vit B12 1000 mcg [J3420] 2)  Admin of Therapeutic Inj  intramuscular or subcutaneous [[id number]]

## 2010-10-05 NOTE — Assessment & Plan Note (Signed)
  Nurse Visit   Preventive Screening-Counseling & Management  Comments: Patient does not meet criteria for IBS-C study due to gastroparesis per medical monitor.  Allergies: 1)  ! * Aleve

## 2010-10-05 NOTE — Assessment & Plan Note (Signed)
Summary: SORE THROAT/DLO   Vital Signs:  Patient Profile:   40 Years Old Female Weight:      237.38 pounds Temp:     98.8 degrees F oral Pulse rate:   92 / minute Pulse rhythm:   regular BP sitting:   122 / 68  (left arm) Cuff size:   large  Vitals Entered By: Delilah Shan (August 21, 2008 8:24 AM)                 Chief Complaint:  Sore throat.  Acute Visit History:      The patient complains of earache, nasal discharge, and sore throat.  These symptoms began 2 days ago.  She denies cough, fever, and sinus problems.  Other comments include: post nasal drip severe throat pain now.        The earache is located on both sides.          Current Allergies (reviewed today): ! * ALEVE   Social History:    Reviewed history from 02/13/2008 and no changes required:       Marital Status: Married       Children: 3       Occupation: [employer]       Former Smoker, > 10 years   Risk Factors:  Tobacco use:  quit   Review of Systems      See HPI   Physical Exam  General:     Well-developed,well-nourished,in no acute distress; alert,appropriate and cooperative throughout examination Ears:     clear fluid  TMs Nose:     External nasal examination shows no deformity or inflammation. Nasal mucosa are pink and moist without lesions or exudates. Mouth:     pharyngeal erythema, no tonsils,  Neck:     B anterior cervical lymphadenopathy Lungs:     Normal respiratory effort, chest expands symmetrically. Lungs are clear to auscultation, no crackles or wheezes. Heart:     Normal rate and regular rhythm. S1 and S2 normal without gallop, murmur, click, rub or other extra sounds.    Impression & Recommendations:  Problem # 1:  PHARYNGITIS, ACUTE (ICD-462) Most likely viral.  Treate symptomatically with tylenol.  If not improving in 48-72 hours and progressing to cough, may fill antibiotics.  The following medications were removed from the medication list:    Zithromax  Z-pak 250 Mg Tabs (Azithromycin) .Marland Kitchen... Take by mouth as directed  Her updated medication list for this problem includes:    Amoxicillin 500 Mg Cap (Amoxicillin) .Marland Kitchen... Take 2 capsule by mouth two times a day x 10 days  Orders: Rapid Strep (81191)   Complete Medication List: 1)  Amoxicillin 500 Mg Cap (Amoxicillin) .... Take 2 capsule by mouth two times a day x 10 days     Prescriptions: AMOXICILLIN 500 MG CAP (AMOXICILLIN) Take 2 capsule by mouth two times a day X 10 days  #40 x 0   Entered and Authorized by:   Kerby Nora MD   Signed by:   Kerby Nora MD on 08/21/2008   Method used:   Print then Give to Patient   RxID:   [id number]  ] Prior Medications (reviewed today): None Current Allergies (reviewed today): ! * ALEVE Current Medications (including changes made in today's visit):  AMOXICILLIN 500 MG CAP (AMOXICILLIN) Take 2 capsule by mouth two times a day X 10 days  Laboratory Results  Date/Time Received: August 21, 2008 9:05 AM  Date/Time Reported: August 21, 2008 9:05 AM  Other Tests  Rapid Strep: negative

## 2010-10-05 NOTE — Letter (Signed)
Summary: Results Letter  [facility]  [address] [place], [place] [address]   Phone: [phone]  Fax: [phone]        September 02, 2009 MRN: [id number]    Holly Chung [address] [place], [place]  [address]    Dear [name],  It is my pleasure to have treated you recently as a new patient in my office. I appreciate your confidence and the opportunity to participate in your care.  Since I do have a busy inpatient endoscopy schedule and office schedule, my office hours vary weekly. I am, however, available for emergency calls everyday through my office. If I am not available for an urgent office appointment, another one of our gastroenterologist will be able to assist you.  My well-trained staff are prepared to help you at all times. For emergencies after office hours, a physician from our Gastroenterology section is always available through my 24 hour answering service  Once again I welcome you as a new patient and I look forward to a happy and healthy relationship             Sincerely,  Louis Meckel MD  This letter has been electronically signed by your physician.  Appended Document: Results Letter mailed

## 2010-10-05 NOTE — Assessment & Plan Note (Signed)
Summary: B12/DLO   Nurse Visit   Allergies: 1)  ! * Aleve  Medication Administration  Injection # 1:    Medication: Vit B12 1000 mcg    Diagnosis: VITAMIN B12 DEFICIENCY (ICD-266.2)    Route: IM    Site: L deltoid    Exp Date: 08/06/2011    Lot #: [id number]    Mfr: American Regent    Patient tolerated injection without complications    Given by: Lewanda Rife LPN (March 26, 2010 8:32 AM)  Orders Added: 1)  Vit B12 1000 mcg [J3420] 2)  Admin of Therapeutic Inj  intramuscular or subcutaneous [[id number]]

## 2010-10-05 NOTE — Assessment & Plan Note (Signed)
Summary: TOWER B12/RBH   Nurse Visit   Allergies: 1)  ! * Aleve  Medication Administration  Injection # 1:    Medication: Vit B12 1000 mcg    Diagnosis: VITAMIN B12 DEFICIENCY (ICD-266.2)    Route: IM    Site: R deltoid    Exp Date: 08/05/2010    Lot #: [id number]    Mfr: American Regent    Patient tolerated injection without complications    Given by: Lewanda Rife LPN (May 22, 2009 8:47 AM)  Orders Added: 1)  Vit B12 1000 mcg [J3420] 2)  Admin of Therapeutic Inj  intramuscular or subcutaneous [[id number]]

## 2010-10-05 NOTE — Miscellaneous (Signed)
Summary: med order  Clinical Lists Changes  Medications: Added new medication of REGLAN 10 MG TABS (METOCLOPRAMIDE HCL) Take 1 p.o. one half hr. a.c and hs - Signed Rx of REGLAN 10 MG TABS (METOCLOPRAMIDE HCL) Take 1 p.o. one half hr. a.c and hs;  #120 x 1;  Signed;  Entered by: Teryl Lucy RN;  Authorized by: Louis Meckel MD;  Method used: Electronically to [facility] [address] [phone]*, [address] [place], [place], [place]  [address], Ph: [phone] or [phone], Fax: [phone]    Prescriptions: REGLAN 10 MG TABS (METOCLOPRAMIDE HCL) Take 1 p.o. one half hr. a.c and hs  #120 x 1   Entered by:   Teryl Lucy RN   Authorized by:   Louis Meckel MD   Signed by:   Teryl Lucy RN on 11/11/2009   Method used:   Electronically to        [facility] [address] [phone]* (retail)       [address]       [place], [place]  [address]       Ph: [phone] or [phone]       Fax: [phone]   RxID:   [id number]

## 2010-10-05 NOTE — Procedures (Signed)
Summary: Upper Endoscopy  Patient: Holly Chung Note: All result statuses are Final unless otherwise noted.  Tests: (1) Upper Endoscopy (EGD)   EGD Upper Endoscopy       DONE     [facility]     [address]     [place], [place]  [address]           ENDOSCOPY PROCEDURE REPORT           PATIENT:  Holly, Chung  MR#:  [id number]     BIRTHDATE:  [birth year], 38 yrs. old  GENDER:  female           ENDOSCOPIST:  Barbette Hair. Arlyce Dice, MD     Referred by:  Marne A. Milinda Antis, M.D.           PROCEDURE DATE:  09/03/2009     PROCEDURE:  EGD, diagnostic     ASA CLASS:  Class I     INDICATIONS:  GERD           MEDICATIONS:   Fentanyl 100 mcg IV, Versed 10 mg IV, Benadryl 12.5     mg IV, glycopyrrolate (Robinal) 0.2 mg IV, 0.6cc simethancone 0.6     cc PO     TOPICAL ANESTHETIC:  Exactacain Spray           DESCRIPTION OF PROCEDURE:   After the risks benefits and     alternatives of the procedure were thoroughly explained, informed     consent was obtained.  The LB [id number] endoscope was     introduced through the mouth and advanced to the third portion of     the duodenum, without limitations.  The instrument was slowly     withdrawn as the mucosa was fully examined.     <<PROCEDUREIMAGES>>           The upper, middle, and distal third of the esophagus were     carefully inspected and no abnormalities were noted. The z-line     was well seen at the GEJ. The endoscope was pushed into the fundus     which was normal including a retroflexed view. The antrum,gastric     body, first and second part of the duodenum were unremarkable (see     image4, image5, and image3).    Retroflexed views revealed no     abnormalities.    The scope was then withdrawn from the patient     and the procedure completed.           COMPLICATIONS:  None           ENDOSCOPIC IMPRESSION:     1) Normal EGD     RECOMMENDATIONS:     1) continue current medications - dexilant     2) Call office next 2-3  days to schedule an office appointment     for 1 month           REPEAT EXAM:  No           ______________________________     Barbette Hair. Arlyce Dice, MD           CC:           n.     eSIGNED:   Barbette Hair. Kaplan at 09/03/2009 04:45 PM           Thorman, [name], [phone]  Note: An exclamation mark (!) indicates a result that was not dispersed into the flowsheet. Document Creation Date: 09/03/2009 4:45 PM  _______________________________________________________________________  (1) Order result status: Final Collection or observation date-time: 09/03/2009 16:42 Requested date-time:  Receipt date-time:  Reported date-time:  Referring Physician:   Ordering Physician: Melvia Heaps [phone]) Specimen Source:  Source: Launa Grill Order Number: [id number] Lab site:

## 2010-10-05 NOTE — Assessment & Plan Note (Signed)
Summary: POISON OAK ?? / LFW   Vital Signs:  Patient Profile:   40 Years Old Female Weight:      231.2 pounds Pulse rate:   68 / minute BP sitting:   120 / 78  (left arm)  Vitals Entered By: Doristine Devoid (Jan 31, 2008 9:23 AM)                 Chief Complaint:  poison oak.  History of Present Illness: red, blistering rash on left arm, stomache , legs, face puritic  was cleaning brush Saturday, noted rash after  no tounge swelling or SOB.  taking Benadryl      Social History:    Reviewed history and no changes required:    Review of Systems      See HPI  General      Denies fatigue.  CV      Denies shortness of breath with exertion and swelling of feet.  Resp      Denies chest pain with inspiration.   Physical Exam  General:     Well-developed,well-nourished,in no acute distress; alert,appropriate and cooperative throughout examination Mouth:     Oral mucosa and oropharynx without lesions or exudates.  Teeth in good repair. Lungs:     Normal respiratory effort, chest expands symmetrically. Lungs are clear to auscultation, no crackles or wheezes. Heart:     Normal rate and regular rhythm. S1 and S2 normal without gallop, murmur, click, rub or other extra sounds. Skin:     red plque, with blisters in excoriation pattern on extremeties Cervical Nodes:     no cervical or supraclavicular lymphadenopathy     Impression & Recommendations:  Problem # 1:  CONTACT DERMATITIS&OTHER ECZEMA DUE TO PLANTS (ICD-692.6)  Her updated medication list for this problem includes:    Prednisone 20 Mg Tabs (Prednisone) .Marland KitchenMarland KitchenMarland KitchenMarland Kitchen 3 tabs by mouth daily x 3 days, then 2 tabs by mouth daily x 2 days then 1 tab by mouth daily x 2 days   Complete Medication List: 1)  Prednisone 20 Mg Tabs (Prednisone) .... 3 tabs by mouth daily x 3 days, then 2 tabs by mouth daily x 2 days then 1 tab by mouth daily x 2 days    Prescriptions: PREDNISONE 20 MG  TABS (PREDNISONE) 3 tabs  by mouth daily x 3 days, then 2 tabs by mouth daily x 2 days then 1 tab by mouth daily x 2 days  #15 x 0   Entered and Authorized by:   Kerby Nora MD   Signed by:   Kerby Nora MD on 01/31/2008   Method used:   Electronically sent to ...       [facility]  [address]  [facility]       [address]       [place], [place]  [address]       Ph: [phone] or [phone]       Fax: [phone]   RxID:   [id number]  ]

## 2010-10-05 NOTE — Assessment & Plan Note (Signed)
Summary: tower b12/rbh   Nurse Visit   Allergies: 1)  ! * Aleve  Medication Administration  Injection # 1:    Medication: Vit B12 1000 mcg    Diagnosis: VITAMIN B12 DEFICIENCY (ICD-266.2)    Route: IM    Site: R deltoid    Exp Date: 07/07/2011    Lot #: [id number]    Mfr: American Regent    Patient tolerated injection without complications    Given by: Linde Gillis CMA Duncan Dull) (November 06, 2009 8:53 AM)  Orders Added: 1)  Vit B12 1000 mcg [J3420] 2)  Admin of Therapeutic Inj  intramuscular or subcutaneous [[id number]]

## 2010-10-05 NOTE — Assessment & Plan Note (Signed)
Summary: F/U APPT...LSW.    History of Present Illness Visit Type: Follow-up Visit Primary GI MD: Melvia Heaps MD [facility] Primary Provider: Roxy Manns, MD Requesting Provider: n/a Chief Complaint: f/u gastroparesis. Pt states she does Bolds have problems with food coming back up into her esophagus.  History of Present Illness:   Ms. Carrizales has  returned for followup of her gastroparesis and constipation.  She became more symptomatic when she was switched from Reglan to erythromycin.  She complains of regurgitation of gastric contents.  Despite daily fiber supplements she Mcdougall has constipation.  She may go 4-5 days without a bowel movement.  At other times she has a bowel movement daily.  Constipation responds to MiraLax.   GI Review of Systems    Reports nausea and  vomiting.      Denies abdominal pain, acid reflux, belching, bloating, chest pain, dysphagia with liquids, dysphagia with solids, heartburn, loss of appetite, vomiting blood, weight loss, and  weight gain.        Denies anal fissure, black tarry stools, change in bowel habit, constipation, diarrhea, diverticulosis, fecal incontinence, heme positive stool, hemorrhoids, irritable bowel syndrome, jaundice, light color stool, liver problems, rectal bleeding, and  rectal pain.    Current Medications (verified): 1)  Omeprazole 20 Mg Cpdr (Omeprazole) .Marland Kitchen.. 1 By Mouth Once Daily 2)  Alprazolam 0.5 Mg Tabs (Alprazolam) .Marland Kitchen.. 1 By Mouth Once Daily As Needed Severe Anxiety Caution- It Does Sedate 3)  Vitamin B Complex-C   Caps (B Complex-C) .... Otc As Directed. 4)  Vitamin B-12 500 Mcg  Tabs (Cyanocobalamin) .... Take Two Tabs Daily 5)  Cyanocobalamin 1000 Mcg/ml Soln (Cyanocobalamin) .... Monthly Injectiions 6)  Hyomax-Sl 0.125 Mg Subl (Hyoscyamine Sulfate) .... Take 2 Tabs Sublingual Q.4 H. P.r.n.  Allergies (verified): 1)  ! * Aleve  Past History:  Past Medical History: IBS reactive airways recurrent sinusitis   obesity GERD Anemia Depression Diverticulosis Gastroparesis  Past Surgical History: Reviewed history from 09/02/2009 and no changes required. Discectomy L5, S1 (06/2003) 3/10 bladder sling/incontinence surgery urol-- Tannenbaum Tonsillectomy  Family History: Reviewed history from 10/27/2009 and no changes required. Father: emphysema Mother:  Siblings: brother died age 68- brain tumor, brother died age 20- leukemia MGM with heart probs Family History of Heart Disease: Father Family History of Colon Cancer:Maternal Aunt   Social History: Reviewed history from 03/01/2010 and no changes required. Marital Status: Married Children: 3 Occupation: [employer] Former Smoker, > 10 years Alcohol Use - no Daily Caffeine Use tea in the morning Illicit Drug Use - no  Review of Systems  The patient denies allergy/sinus, anemia, anxiety-new, arthritis/joint pain, back pain, blood in urine, breast changes/lumps, change in vision, confusion, cough, coughing up blood, depression-new, fainting, fatigue, fever, headaches-new, hearing problems, heart murmur, heart rhythm changes, itching, menstrual pain, muscle pains/cramps, night sweats, nosebleeds, pregnancy symptoms, shortness of breath, skin rash, sleeping problems, sore throat, swelling of feet/legs, swollen lymph glands, thirst - excessive , urination - excessive , urination changes/pain, urine leakage, vision changes, and voice change.    Vital Signs:  Patient profile:   40 year old female Height:      67.5 inches Weight:      246.50 pounds BMI:     38.18 Pulse rate:   70 / minute Pulse rhythm:   regular BP sitting:   118 / 70  (right arm) Cuff size:   regular  Vitals Entered By: Christie Nottingham CMA Duncan Dull) (June 07, 2010 8:36 AM)   Impression &  Recommendations:  Problem # 1:  GASTROPARESIS (ICD-536.3) Assessment Unchanged Plan to begin domperidone 10 mg one half hour a.c. and h.s.  Problem # 2:  CONSTIPATION, INTERMITTENT  (ICD-564.00) In addition to fiber supplementation the patient was instructed to use MiraLax if no bowel movement after 2-3 days.  Patient Instructions: 1)  Copy sent to : Roxy Manns, MD 2)  You will need to schedule a follow up office appointment in 6 weeks 3)  We are giving you a printed rx of Domperidone to fax 4)  The medication list was reviewed and reconciled.  All changed / newly prescribed medications were explained.  A complete medication list was provided to the patient / caregiver. Prescriptions: OMEPRAZOLE 20 MG CPDR (OMEPRAZOLE) 1 by mouth once daily  #90 x 4   Entered by:   Merri Ray CMA (AAMA)   Authorized by:   Louis Meckel MD   Signed by:   Merri Ray CMA (AAMA) on 06/07/2010   Method used:   Electronically to        [facility] [address] [phone]* (retail)       [address]       [place], [place]  [address]       Ph: [phone] or [phone]       Fax: [phone]   RxID:   [id number] OMEPRAZOLE 20 MG CPDR (OMEPRAZOLE) 1 by mouth once daily  #90 x 4   Entered and Authorized by:   Louis Meckel MD   Signed by:   Louis Meckel MD on 06/07/2010   Method used:   Print then Give to Patient   RxID:   [id number] DOMPERIDONE 10MG  TAKE 1 by mouth before each meal and at bedtime  #120 x 2   Entered by:   Merri Ray CMA (AAMA)   Authorized by:   Louis Meckel MD   Signed by:   Merri Ray CMA (AAMA) on 06/07/2010   Method used:   Print then Give to Patient   RxID:   [id number]

## 2010-10-05 NOTE — Assessment & Plan Note (Signed)
Summary: 3 MONTH FOLLOW UP/RBH   Vital Signs:  Patient profile:   40 year old female Weight:      253 pounds Temp:     98.1 degrees F oral Pulse rate:   72 / minute Pulse rhythm:   regular BP sitting:   120 / 70  (left arm) Cuff size:   large  Vitals Entered By: Lowella Petties CMA (August 07, 2009 8:47 AM) CC: 3 Month follow up   History of Present Illness: here for f/u of multiple issues  is feeling fair overall   B12 def f/u level in  mid 300s inst to get shots once monthly feels a little more tired at end of month when she is due -- and wants to sleep more  is due for shot next week- and wants to get it early   GERD-on prilosec and burning part is gone completely Droge has reflux of food and air into her throat  was offered GI ref -- wants to go forward with that   really wants to loose wt and cutting out fat and cutting her portion size is thinking about swimming for exericse  ? if could ride a bike (has one at work)  stress level/anx-- is better than it was  only took one alprazolam and working on schedule to make changes  Allergies: 1)  ! * Aleve  Past History:  Past Medical History: Last updated: 05/08/2009 IBS reactive airways recurrent sinusitis  obesity GERD  Past Surgical History: Last updated: 11/30/2008 Discectomy L5, S1 (06/2003) 3/10 bladder sling/incontinence surgery   urol-- Patsi Sears  Family History: Last updated: 05/08/2009 Father: emphysema Mother:  Siblings: brother died age 55- brain tumor, brother died age 45- leukemia MGM with heart probs  Social History: Last updated: 08/21/2008 Marital Status: Married Children: 3 Occupation: [employer] Former Smoker, > 10 years  Risk Factors: Smoking Status: quit (08/21/2008)  Review of Systems General:  Complains of fatigue; denies loss of appetite, malaise, and sleep disorder. Eyes:  Denies blurring and eye pain. CV:  Denies chest pain or discomfort and lightheadness. Resp:   Denies cough and wheezing. GI:  Complains of indigestion; denies abdominal pain, change in bowel habits, diarrhea, loss of appetite, nausea, and vomiting. GU:  Denies dysuria and urinary frequency. MS:  Denies joint pain. Derm:  Denies itching, lesion(s), and rash. Neuro:  Denies numbness. Psych:  Denies panic attacks and sense of great danger; mood is some better- working on stress. Endo:  Denies excessive thirst and excessive urination. Heme:  Denies abnormal bruising and bleeding.  Physical Exam  General:  overweight but generally well appearing  Head:  normocephalic, atraumatic, and no abnormalities observed.   Eyes:  vision grossly intact, pupils equal, pupils round, and pupils reactive to light.   Mouth:  pharynx pink and moist.   Neck:  supple with full rom and no masses or thyromegally, no JVD or carotid bruit  Lungs:  CTA with good air exch - slt distant bs no wheeze, crackles or rales  nl exp phase  Heart:  Normal rate and regular rhythm. S1 and S2 normal without gallop, murmur, click, rub or other extra sounds. Abdomen:  Bowel sounds positive,abdomen soft and non-tender without masses, organomegaly or hernias noted. Msk:  No deformity or scoliosis noted of thoracic or lumbar spine.   Extremities:  No clubbing, cyanosis, edema, or deformity noted with normal full range of motion of all joints.   Neurologic:  sensation intact to light touch, gait  normal, and DTRs symmetrical and normal.   Skin:  Intact without suspicious lesions or rashes Cervical Nodes:  No lymphadenopathy noted Psych:  normal affect, talkative and pleasant    Impression & Recommendations:  Problem # 1:  VITAMIN B12 DEFICIENCY (ICD-266.2) Assessment Improved  overall is stable on monthly shots  inj today inst to continue b complex vit daily  Orders: Vit B12 1000 mcg (J3420) Admin of Therapeutic Inj  intramuscular or subcutaneous (16109)  Problem # 2:  ESOPHAGEAL REFLUX (ICD-530.81) Assessment:  Improved imp in acid but Jeffus mechanical reflux symptoms  disc wt loss - this would help (thinking about wt watchers)  continue ppi  bed elevation ref GI Her updated medication list for this problem includes:    Prilosec 20 Mg Cpdr (Omeprazole) .Marland Kitchen... 1 by mouth once daily in am  Orders: Gastroenterology Referral (GI)  Problem # 3:  STRESS REACTION, ACUTE, WITH EMOTIONAL DISTURBANCE (ICD-308.0) Assessment: Improved overall doing better with some changes at work needs to start exercise   Complete Medication List: 1)  Prilosec 20 Mg Cpdr (Omeprazole) .Marland Kitchen.. 1 by mouth once daily in am 2)  Alprazolam 0.5 Mg Tabs (Alprazolam) .Marland Kitchen.. 1 by mouth once daily as needed severe anxiety caution- it does sedate 3)  Vitamin B Complex-c Caps (B complex-c) .... Otc as directed. 4)  Vitamin B-12 500 Mcg Tabs (Cyanocobalamin) .... Take two tabs daily  Patient Instructions: 1)  we will do GI referral at check out for reflux  2)  B12 shot today 3)  schedule next B12 shot one month  4)  continue your B complex vitamin daily  Prescriptions: PRILOSEC 20 MG CPDR (OMEPRAZOLE) 1 by mouth once daily in am  #90 x 3   Entered and Authorized by:   Judith Part MD   Signed by:   Judith Part MD on 08/07/2009   Method used:   Print then Give to Patient   RxID:   [id number]   Prior Medications (reviewed today): PRILOSEC 20 MG CPDR (OMEPRAZOLE) 1 by mouth once daily in am ALPRAZOLAM 0.5 MG TABS (ALPRAZOLAM) 1 by mouth once daily as needed severe anxiety caution- it does sedate VITAMIN B COMPLEX-C   CAPS (B COMPLEX-C) OTC As directed. VITAMIN B-12 500 MCG  TABS (CYANOCOBALAMIN) take two tabs daily Current Allergies: ! * ALEVE   Medication Administration  Injection # 1:    Medication: Vit B12 1000 mcg    Diagnosis: VITAMIN B12 DEFICIENCY (ICD-266.2)    Route: IM    Site: R deltoid    Exp Date: 04/2011    Lot #: [id number]    Mfr: [employer] Regent    Patient tolerated injection without  complications    Given by: Lowella Petties CMA (August 07, 2009 9:57 AM)  Orders Added: 1)  Gastroenterology Referral [GI] 2)  Vit B12 1000 mcg [J3420] 3)  Admin of Therapeutic Inj  intramuscular or subcutaneous [96372] 4)  Est. Patient Level III [13086]

## 2010-10-05 NOTE — Assessment & Plan Note (Signed)
Summary: TOWER B12/RBH   Nurse Visit   Allergies: 1)  ! * Aleve  Medication Administration  Injection # 1:    Medication: Vit B12 1000 mcg    Diagnosis: VITAMIN B12 DEFICIENCY (ICD-266.2)    Route: IM    Site: L deltoid    Exp Date: 07/07/2011    Lot #: [id number]    Mfr: [employer] Regent    Patient tolerated injection without complications    Given by: Linde Gillis CMA Duncan Dull) (December 11, 2009 8:34 AM)  Orders Added: 1)  Vit B12 1000 mcg [J3420] 2)  Admin of Therapeutic Inj  intramuscular or subcutaneous [23557]

## 2010-10-05 NOTE — Progress Notes (Signed)
Summary: Transderm Sc[facility]  Phone Note Call from Patient Call back at Work Phone [phone]   Caller: Patient Call For: Judith Part MD Summary of Call: Patient is going on a cruise, wants medication for motion sickness. Asked about an ear patch or something like that.  Initial call taken by: Melody Comas,  June 25, 2009 9:46 AM  Follow-up for Phone Call        scopalamine patch- px written on EMR for call in warn- this can sedate  Follow-up by: Judith Part MD,  June 25, 2009 10:13 AM  Additional Follow-up for Phone Call Additional follow up Details #1::         Medication phoned to [facility] on [address] pharmacy [phone] as instructed. Patient notified as instructed by telephone.  Additional Follow-up by: Lewanda Rife LPN,  June 25, 2009 11:02 AM    New/Updated Medications: TRANSDERM-SCOP 1.5 MG PT72 (SCOPOLAMINE BASE) apply one patch as directed every 3 days for motion sickness Prescriptions: TRANSDERM-SCOP 1.5 MG PT72 (SCOPOLAMINE BASE) apply one patch as directed every 3 days for motion sickness  #7 x 0   Entered and Authorized by:   Judith Part MD   Signed by:   Judith Part MD on 06/25/2009   Method used:   Telephoned to ...       [facility] (retail)       [address]       [place], [place]  [address]       Ph: [phone] or [phone]       Fax: [phone]   RxID:   [id number]

## 2010-10-05 NOTE — Progress Notes (Signed)
Summary: refill  Medications Added DEXILANT 60 MG CPDR (DEXLANSOPRAZOLE) 1 by mouth once daily       Phone Note Call from Patient Call back at Work Phone [phone]   Caller: Patient Call For: Dr. Arlyce Dice Reason for Call: Refill Medication Summary of Call: ran out of Dexilant samples and would like an rx... [facility] on [facility]  Initial call taken by: Vallarie Mare,  December 07, 2009 10:57 AM  Follow-up for Phone Call        Called pt to inform med was sent Follow-up by: Merri Ray CMA Duncan Dull),  December 07, 2009 1:17 PM    New/Updated Medications: DEXILANT 60 MG CPDR (DEXLANSOPRAZOLE) 1 by mouth once daily Prescriptions: DEXILANT 60 MG CPDR (DEXLANSOPRAZOLE) 1 by mouth once daily  #30 x 6   Entered by:   Merri Ray CMA (AAMA)   Authorized by:   Louis Meckel MD   Signed by:   Merri Ray CMA (AAMA) on 12/07/2009   Method used:   Electronically to        [facility] [address] [phone]* (retail)       [address]       [place], [place]  [address]       Ph: [phone] or [phone]       Fax: [phone]   RxID:   [id number]

## 2010-10-05 NOTE — Assessment & Plan Note (Signed)
Summary: B-12 shot/ alc   Nurse Visit   Allergies: 1)  ! * Aleve  Medication Administration  Injection # 1:    Medication: Vit B12 1000 mcg    Diagnosis: VITAMIN B12 DEFICIENCY (ICD-266.2)    Route: IM    Site: L deltoid    Exp Date: 06/2011    Lot #: [id number]    Mfr: American Regent    Patient tolerated injection without complications    Given by: Lowella Petties CMA (October 09, 2009 11:42 AM)  Orders Added: 1)  Vit B12 1000 mcg [J3420] 2)  Admin of Therapeutic Inj  intramuscular or subcutaneous [96372]  Prior Medications: DEXILANT 60 MG CPDR (DEXLANSOPRAZOLE) 1 by mouth once daily ALPRAZOLAM 0.5 MG TABS (ALPRAZOLAM) 1 by mouth once daily as needed severe anxiety caution- it does sedate VITAMIN B COMPLEX-C   CAPS (B COMPLEX-C) OTC As directed. VITAMIN B-12 500 MCG  TABS (CYANOCOBALAMIN) take two tabs daily CYANOCOBALAMIN 1000 MCG/ML SOLN (CYANOCOBALAMIN) Monthly injectiions Current Allergies: ! * ALEVE   Medication Administration  Injection # 1:    Medication: Vit B12 1000 mcg    Diagnosis: VITAMIN B12 DEFICIENCY (ICD-266.2)    Route: IM    Site: L deltoid    Exp Date: 06/2011    Lot #: [id number]    Mfr: American Regent    Patient tolerated injection without complications    Given by: Lowella Petties CMA (October 09, 2009 11:42 AM)  Orders Added: 1)  Vit B12 1000 mcg [J3420] 2)  Admin of Therapeutic Inj  intramuscular or subcutaneous [[id number]]

## 2010-10-05 NOTE — Assessment & Plan Note (Signed)
Summary: Hezakiah Champeau B12/RBH   Nurse Visit   Allergies: 1)  ! * Aleve  Medication Administration  Injection # 1:    Medication: Vit B12 1000 mcg    Diagnosis: VITAMIN B12 DEFICIENCY (ICD-266.2)    Route: IM    Site: R deltoid    Exp Date: 01/2011    Lot #: [id number]    Mfr: [employer] Regent    Patient tolerated injection without complications    Given by: Lowella Petties CMA (May 29, 2009 8:38 AM)  Orders Added: 1)  Vit B12 1000 mcg [J3420] 2)  Admin of Therapeutic Inj  intramuscular or subcutaneous [96372]   Medication Administration  Injection # 1:    Medication: Vit B12 1000 mcg    Diagnosis: VITAMIN B12 DEFICIENCY (ICD-266.2)    Route: IM    Site: R deltoid    Exp Date: 01/2011    Lot #: [id number]    Mfr: American Regent    Patient tolerated injection without complications    Given by: Lowella Petties CMA (May 29, 2009 8:38 AM)  Orders Added: 1)  Vit B12 1000 mcg [J3420] 2)  Admin of Therapeutic Inj  intramuscular or subcutaneous [10272]

## 2010-10-05 NOTE — Progress Notes (Signed)
Summary: ? medication adjustment  Phone Note Call from Patient Call back at Work Phone [phone]   Caller: Patient Call For: Judith Part MD Summary of Call: Patient says she is taking the medication for her reflux but at night when she lays down, it feels like her food is coming up.  It does not burn like before, just that her food is coming up in her throat.  I advised being adiment about not eating for 3-4 hours before retiring for the night and avoiding spicey and greasy foods and carbonated beverages.  She says she is doing good with those measures and the situation has definitely improved but she wonders if she needs to take more of the medication or maybe a second dose.  We can leave a message on her voicemail if she is not available. Initial call taken by: Delilah Shan CMA Duncan Dull),  July 01, 2009 9:01 AM  Follow-up for Phone Call        the med will help acid - but not necessarily the physical reflux of food  I'm glad she is following good lifestyle habits  elevating head of bed (with brick or lift) - is sometimes helpful for symptoms  I would like to ref her to GI for further eval -- let me know if she is agreeable Follow-up by: Judith Part MD,  July 01, 2009 9:19 AM  Additional Follow-up for Phone Call Additional follow up Details #1::        LMOM to call.                   Lowella Petties CMA  July 01, 2009 9:46 AM   Patient now also says she is having burning at the end of her urine stream and her urine smells "sweet".    Please reach her at [phone].  needs to f/u for that  if no appts avail- put her in for 4:15 appt with me  Additional Follow-up by: Delilah Shan CMA (AAMA),  July 01, 2009 11:18 AM    Additional Follow-up for Phone Call Additional follow up Details #2::    Left message on voicemail  in detail.  Personalized VM. Lugene Fuquay CMA (AAMA)  July 01, 2009 11:55 AM

## 2010-10-05 NOTE — Miscellaneous (Signed)
Summary: [facility]/Schulenklopper PT, DPT  [facility]/Schulenklopper PT, DPT   Imported By: Eleonore Chiquito 12/03/2008 16:30:31  _____________________________________________________________________  External Attachment:    Type:   Image     Comment:   External Document

## 2010-10-05 NOTE — Assessment & Plan Note (Signed)
Summary: B-12/JRR   Nurse Visit   Allergies: 1)  ! * Aleve  Medication Administration  Injection # 1:    Medication: Vit B12 1000 mcg    Diagnosis: VITAMIN B12 DEFICIENCY (ICD-266.2)    Route: IM    Site: R deltoid    Exp Date: 07/07/2011    Lot #: [id number]    Mfr: American Regent    Patient tolerated injection without complications    Given by: Linde Gillis CMA Duncan Dull) (Jan 08, 2010 8:38 AM)  Orders Added: 1)  Vit B12 1000 mcg [J3420] 2)  Admin of Therapeutic Inj  intramuscular or subcutaneous [[id number]]

## 2010-10-05 NOTE — Letter (Signed)
Summary: [facility]/Dr. Lewit  [facility]/Dr. Lewit   Imported By: Eleonore Chiquito 10/06/2008 15:14:37  _____________________________________________________________________  External Attachment:    Type:   Image     Comment:   External Document

## 2010-10-05 NOTE — Consult Note (Signed)
Summary: [facility]/Office Visit/Dr. [facility]/Office Visit/Dr. Lestine Box   Imported By: Mickle Asper 06/12/2008 08:56:36  _____________________________________________________________________  External Attachment:    Type:   Image     Comment:   External Document

## 2010-10-05 NOTE — Medication Information (Signed)
Summary: Prior [facility] & Approved for Dexilant/[facility]  Prior Duard Brady & Approved for Dexilant/[facility]   Imported By: Sherian Rein 12/21/2009 07:54:21  _____________________________________________________________________  External Attachment:    Type:   Image     Comment:   External Document

## 2010-10-05 NOTE — Assessment & Plan Note (Signed)
Summary: ONE WEEK FOLLOW UP / LFW   Vital Signs:  Patient profile:   40 year old female Height:      67.5 inches Weight:      248 pounds BMI:     38.41 Temp:     97.9 degrees F oral Pulse rate:   84 / minute Pulse rhythm:   regular BP sitting:   130 / 76  (left arm) Cuff size:   large  Vitals Entered By: Liane Comber CMA Duncan Dull) (May 15, 2009 8:37 AM)  History of Present Illness: here for f/u of several issues  last visit- severe reflux symptoms  given omeprazole  has overall improved -- with less episodes  one night ate [place] food- made it worse    also did labs for dyspnea incl bnp bnp nl  chol good with HDL 39 andLDL 71  fatigue- did find low B12 at 185 has never had before  did get B12 supplement and started it daily   stress may be playing role in her symptoms  she did have a "breakdown" at work-- and will review job resp with boss   has not been to counselor in past  has EAP program at work  Allergies: 1)  ! * Aleve  Past History:  Past Medical History: Last updated: 05/08/2009 IBS reactive airways recurrent sinusitis  obesity GERD  Past Surgical History: Last updated: 11/30/2008 Discectomy L5, S1 (06/2003) 3/10 bladder sling/incontinence surgery   urol-- Patsi Sears  Family History: Last updated: 05/08/2009 Father: emphysema Mother:  Siblings: brother died age 73- brain tumor, brother died age 82- leukemia MGM with heart probs  Social History: Last updated: 08/21/2008 Marital Status: Married Children: 3 Occupation: [employer] Former Smoker, > 10 years  Risk Factors: Smoking Status: quit (08/21/2008)  Review of Systems General:  Complains of fatigue; denies fever, loss of appetite, and malaise. Eyes:  Denies blurring and eye pain. CV:  Denies chest pain or discomfort and lightheadness. Resp:  Denies cough and wheezing. GI:  Denies abdominal pain, bloody stools, and change in bowel habits. MS:  Complains of  stiffness. Derm:  Denies lesion(s), poor wound healing, and rash. Neuro:  Denies numbness, sensation of room spinning, and tingling. Psych:  Complains of anxiety and panic attacks; denies sense of great danger and suicidal thoughts/plans. Endo:  Denies excessive thirst and excessive urination. Heme:  Denies abnormal bruising and bleeding.  Physical Exam  General:  obese but well app Head:  normocephalic, atraumatic, and no abnormalities observed.   Eyes:  vision grossly intact, pupils equal, pupils round, and pupils reactive to light.   Mouth:  pharynx pink and moist.   Neck:  supple with full rom and no masses or thyromegally, no JVD or carotid bruit  Lungs:  CTA with good air exch - slt distant bs no wheeze, crackles or rales  nl exp phase  Heart:  Normal rate and regular rhythm. S1 and S2 normal without gallop, murmur, click, rub or other extra sounds. Abdomen:  Bowel sounds positive,abdomen soft and non-tender without masses, organomegaly or hernias noted. Msk:  No deformity or scoliosis noted of thoracic or lumbar spine.  no acute joint changes Pulses:  R and L carotid,radial,femoral,dorsalis pedis and posterior tibial pulses are full and equal bilaterally Extremities:  No clubbing, cyanosis, edema, or deformity noted with normal full range of motion of all joints.   Neurologic:  sensation intact to light touch, gait normal, and DTRs symmetrical and normal.   Skin:  Intact without  suspicious lesions or rashes Cervical Nodes:  No lymphadenopathy noted Psych:  mildly anx - but pleasant talks freely about situational stress not tearful  good eye contact    Impression & Recommendations:  Problem # 1:  VITAMIN B12 DEFICIENCY (ICD-266.2) Assessment New start B12 shots weekly for 4 weeks lab 6 weeks otc daily oral as well then adv f/u 3 mo  hope this will restore energy  Problem # 2:  STRESS REACTION, ACUTE, WITH EMOTIONAL DISTURBANCE (ICD-308.0) Assessment: Unchanged this  may be causing some physical symptoms- disc this in detail  plans to start counseling through eap at work  given 10 xanax for emergencies with warnings  adv to update if worse f/u 3 mo   Problem # 3:  DYSPNEA (ICD-786.05) Assessment: Improved overall imp with slt dec in stress and tx gerd  rev nl lab/ BNP adv to update if worse/ suspect anx rel  Problem # 4:  FATIGUE (ICD-780.79) Assessment: Improved rev labs  hopeful B12 suppl and stress red will help enc to exercise   Complete Medication List: 1)  Topiramate 25 Mg Tabs (Topiramate) .... Take 1 tablet by mouth once a day 2)  Prilosec 20 Mg Cpdr (Omeprazole) .Marland Kitchen.. 1 by mouth once daily in am 3)  Alprazolam 0.5 Mg Tabs (Alprazolam) .Marland Kitchen.. 1 by mouth once daily as needed severe anxiety caution- it does sedate  Patient Instructions: 1)  continue omeprazole 2)  update me if you do not continue to improve  3)  start B12 shots - one today- then schedule one weekly for 3 more weeks 4)  continue otc B12 vitamins one daily 5)  try to get a little exercise  6)  labs- schedule for B12 level in 6 weeks- then will advise further  7)  follow up in  3 months 8)  plan on starting EAP program at work  9)  use xanax for severe anxiety only -- emergencies  Prescriptions: ALPRAZOLAM 0.5 MG TABS (ALPRAZOLAM) 1 by mouth once daily as needed severe anxiety caution- it does sedate  #10 x 0   Entered and Authorized by:   Judith Part MD   Signed by:   Judith Part MD on 05/15/2009   Method used:   Print then Give to Patient   RxID:   [id number]   Prior Medications (reviewed today): TOPIRAMATE 25 MG TABS (TOPIRAMATE) Take 1 tablet by mouth once a day PRILOSEC 20 MG CPDR (OMEPRAZOLE) 1 by mouth once daily in am ALPRAZOLAM 0.5 MG TABS (ALPRAZOLAM) 1 by mouth once daily as needed severe anxiety caution- it does sedate Current Allergies (reviewed today): ! * ALEVE Current Medications (including changes made in today's visit):  TOPIRAMATE  25 MG TABS (TOPIRAMATE) Take 1 tablet by mouth once a day PRILOSEC 20 MG CPDR (OMEPRAZOLE) 1 by mouth once daily in am ALPRAZOLAM 0.5 MG TABS (ALPRAZOLAM) 1 by mouth once daily as needed severe anxiety caution- it does sedate

## 2010-10-05 NOTE — Assessment & Plan Note (Signed)
Summary: f/u....em    History of Present Illness Visit Type: Follow-up Visit Primary GI MD: Melvia Heaps MD [facility] Primary Provider: Roxy Manns, MD Requesting Provider: n/a Chief Complaint: Patient complains of sharp pains below her left shoulder blade that comes and goes. She Aurich complains that it feel like her food is coming back up her esophagus. History of Present Illness:   Holly Chung has returned for followup of her gastroparesis and constipation.  Despite taking metoclopramide she continues to have significant regurgitation of gastric contents.  Constipation remains a problem and is relieved only after taking MiraLax for several days.  She had an episode of severe pain in the left shoulder blade it lasted for about an hour.   GI Review of Systems      Denies abdominal pain, acid reflux, belching, bloating, chest pain, dysphagia with liquids, dysphagia with solids, heartburn, loss of appetite, nausea, vomiting, vomiting blood, weight loss, and  weight gain.        Denies anal fissure, black tarry stools, change in bowel habit, constipation, diarrhea, diverticulosis, fecal incontinence, heme positive stool, hemorrhoids, irritable bowel syndrome, jaundice, light color stool, liver problems, rectal bleeding, and  rectal pain. Preventive Screening-Counseling & Management      Drug Use:  no.      Current Medications (verified): 1)  Omeprazole 20 Mg Cpdr (Omeprazole) .Marland Kitchen.. 1 By Mouth Once Daily 2)  Alprazolam 0.5 Mg Tabs (Alprazolam) .Marland Kitchen.. 1 By Mouth Once Daily As Needed Severe Anxiety Caution- It Does Sedate 3)  Vitamin B Complex-C   Caps (B Complex-C) .... Otc As Directed. 4)  Vitamin B-12 500 Mcg  Tabs (Cyanocobalamin) .... Take Two Tabs Daily 5)  Cyanocobalamin 1000 Mcg/ml Soln (Cyanocobalamin) .... Monthly Injectiions 6)  Reglan 10 Mg Tabs (Metoclopramide Hcl) .... Take 1 P.o. One Half Hr. A.c and Hs  Allergies (verified): 1)  ! * Aleve  Past History:  Past Medical  History: Reviewed history from 09/02/2009 and no changes required. IBS reactive airways recurrent sinusitis  obesity GERD Anemia Depression Diverticulosis  Past Surgical History: Reviewed history from 09/02/2009 and no changes required. Discectomy L5, S1 (06/2003) 3/10 bladder sling/incontinence surgery urol-- Tannenbaum Tonsillectomy  Family History: Reviewed history from 10/27/2009 and no changes required. Father: emphysema Mother:  Siblings: brother died age 61- brain tumor, brother died age 37- leukemia MGM with heart probs Family History of Heart Disease: Father Family History of Colon Cancer:Maternal Aunt   Social History: Marital Status: Married Children: 3 Occupation: [employer] Former Smoker, > 10 years Alcohol Use - no Daily Caffeine Use tea in the morning Illicit Drug Use - no Drug Use:  no  Review of Systems       The patient complains of back pain.    Vital Signs:  Patient profile:   40 year old female Height:      67.5 inches Weight:      241.0 pounds BMI:     37.32 Pulse rate:   72 / minute Pulse rhythm:   regular BP sitting:   112 / 78  (left arm) Cuff size:   regular  Vitals Entered By: Harlow Mares CMA Duncan Dull) (March 01, 2010 9:01 AM)   Impression & Recommendations:  Problem # 1:  GASTROPARESIS (ICD-536.3) Assessment Unchanged Plan to try erythromycin in lieu of Reglan.  Problem # 2:  IBS (ICD-564.1) Assessment: Unchanged She has constipation predominant IBS.  Plan to continue MiraLax.  She will consider enrollment in IBS trial.  Patient Instructions: 1)  Copy  sent to : Marne Tower,MD 2)  We are sending in 2 new prescriptions for you to your pharmacy 3)  Please schedule a follow up appointment in 1 month 4)  Research will be speaking with you today 5)  The medication list was reviewed and reconciled.  All changed / newly prescribed medications were explained.  A complete medication list was provided to the patient /  caregiver. Prescriptions: HYOMAX-SL 0.125 MG SUBL (HYOSCYAMINE SULFATE) take 2 tabs sublingual q.4 h. p.r.n.  #25 x 1   Entered and Authorized by:   Louis Meckel MD   Signed by:   Louis Meckel MD on 03/01/2010   Method used:   Electronically to        [facility] [address] [phone]* (retail)       [address]       [place], [place]  [address]       Ph: [phone] or [phone]       Fax: [phone]   RxID:   [id number] ERY-TAB 250 MG TBEC (ERYTHROMYCIN BASE) date one Q.i.d.  #28 x 2   Entered and Authorized by:   Louis Meckel MD   Signed by:   Louis Meckel MD on 03/01/2010   Method used:   Electronically to        [facility] [address] [phone]* (retail)       [address]       [place], [place]  [address]       Ph: [phone] or [phone]       Fax: [phone]   RxID:   [id number]   Appended Document: f/u....em    Clinical Lists Changes  Medications: Rx of OMEPRAZOLE 20 MG CPDR (OMEPRAZOLE) 1 by mouth once daily;  #90 x 4;  Signed;  Entered by: Merri Ray CMA (AAMA);  Authorized by: Louis Meckel MD;  Method used: Faxed to [facility], , , [place]  , Ph: , Fax: [phone]    Prescriptions: OMEPRAZOLE 20 MG CPDR (OMEPRAZOLE) 1 by mouth once daily  #90 x 4   Entered by:   Merri Ray CMA (AAMA)   Authorized by:   Louis Meckel MD   Signed by:   Merri Ray CMA (AAMA) on 03/01/2010   Method used:   Faxed to ...       [facility] (mail-order)             , [place]         Ph:        Fax: [phone]   RxID:   [id number]

## 2010-10-05 NOTE — Letter (Signed)
Summary: EGD Instructions  [facility]  [address] [place], [place] [address]   Phone: [phone]  Fax: [phone]       Holly Chung    Mar 21, 1971    MRN: [id number]       Procedure Day /Date:THURSDAY 09/03/2010     Arrival Time: 3:00PM     Procedure Time:4:00PM     Location of Procedure:                    [facility] (4th Floor)    PREPARATION FOR ENDOSCOPY   On 09/03/2010 THE DAY OF THE PROCEDURE:  1.   No solid foods, milk or milk products are allowed after midnight the night before your procedure.  2.   Do not drink anything colored red or purple.  Avoid juices with pulp.  No orange juice.  3.  You may drink clear liquids until 2pm which is 2 hours before your procedure.                                                                                                CLEAR LIQUIDS INCLUDE: Water Jello Ice Popsicles Tea (sugar ok, no milk/cream) Powdered fruit flavored drinks Coffee (sugar ok, no milk/cream) Gatorade Juice: apple, white grape, white cranberry  Lemonade Clear bullion, consomm, broth Carbonated beverages (any kind) Strained chicken noodle soup Hard Candy   MEDICATION INSTRUCTIONS  Unless otherwise instructed, you should take regular prescription medications with a small sip of water as early as possible the morning of your procedure.             OTHER INSTRUCTIONS  You will need a responsible adult at least 42 years of age to accompany you and drive you home.   This person must remain in the waiting room during your procedure.  Wear loose fitting clothing that is easily removed.  Leave jewelry and other valuables at home.  However, you may wish to bring a book to read or an iPod/MP3 player to listen to music as you wait for your procedure to start.  Remove all body piercing jewelry and leave at home.  Total time from sign-in until discharge is approximately 2-3 hours.  You should go home directly  after your procedure and rest.  You can resume normal activities the day after your procedure.  The day of your procedure you should not:   Drive   Make legal decisions   Operate machinery   Drink alcohol   Return to work  You will receive specific instructions about eating, activities and medications before you leave.    The above instructions have been reviewed and explained to me by   _______________________    I fully understand and can verbalize these instructions _____________________________ Date _________

## 2010-10-05 NOTE — Assessment & Plan Note (Signed)
Summary: Vitamin B-12 injection Salley Scarlet   Nurse Visit   Allergies: 1)  ! * Aleve  Medication Administration  Injection # 1:    Medication: Vit B12 1000 mcg    Diagnosis: VITAMIN B12 DEFICIENCY (ICD-266.2)    Route: IM    Site: L deltoid    Exp Date: 02/03/2011    Lot #: [id number]    Mfr: American Regent    Patient tolerated injection without complications    Given by: Delilah Shan CMA (AAMA) (July 10, 2009 8:47 AM)  Orders Added: 1)  Admin of Therapeutic Inj  intramuscular or subcutaneous [96372] 2)  Vit B12 1000 mcg [J3420]   Medication Administration  Injection # 1:    Medication: Vit B12 1000 mcg    Diagnosis: VITAMIN B12 DEFICIENCY (ICD-266.2)    Route: IM    Site: L deltoid    Exp Date: 02/03/2011    Lot #: [id number]    Mfr: American Regent    Patient tolerated injection without complications    Given by: Delilah Shan CMA (AAMA) (July 10, 2009 8:47 AM)  Orders Added: 1)  Admin of Therapeutic Inj  intramuscular or subcutaneous [96372] 2)  Vit B12 1000 mcg [J3420]

## 2010-10-05 NOTE — Assessment & Plan Note (Signed)
Summary: possible uti...cyd   Vital Signs:  Patient profile:   40 year old female Height:      67.5 inches Weight:      254 pounds BMI:     39.34 Temp:     98.9 degrees F oral Pulse rate:   80 / minute Pulse rhythm:   regular BP sitting:   108 / 70  (left arm) Cuff size:   large  Vitals Entered By: Lewanda Rife LPN (July 01, 2009 3:26 PM)  CC:  burn and pain upon urination and frequency of urine with sweet smell to urine.Holly Chung  History of Present Illness: Here for dysuria, urgency and frequency --onset x 3d--getting  worse, pushing water by mouth  --no recent UTI  Allergies: 1)  ! * Aleve  Review of Systems      See HPI  Physical Exam  General:  alert, well-developed, well-nourished, well-hydrated, and overweight-appearing.  NAD Abdomen:  no CVA tenderness, does have suprapubic discomfort Psych:  Oriented X3 and normally interactive.     Impression & Recommendations:  Problem # 1:  UTI (ICD-599.0) Assessment New  will start on cipro two times a day x 7d discussed hygeine and increased water/limitation of soda see back in 3d if not improved--reminded of Sat clinic Her updated medication list for this problem includes:    Ciprofloxacin Hcl 500 Mg Tabs (Ciprofloxacin hcl) .Holly Chung... Take 1 two times a day  Orders: UA Dipstick W/ Micro (manual) (56213)  Complete Medication List: 1)  Prilosec 20 Mg Cpdr (Omeprazole) .Holly Chung.. 1 by mouth once daily in am 2)  Alprazolam 0.5 Mg Tabs (Alprazolam) .Holly Chung.. 1 by mouth once daily as needed severe anxiety caution- it does sedate 3)  Transderm-scop 1.5 Mg Pt72 (Scopolamine base) .... Apply one patch as directed every 3 days for motion sickness 4)  Vitamin B Complex-c Caps (B complex-c) .... Otc as directed. 5)  Vitamin B-12 500 Mcg Tabs (Cyanocobalamin) .... Take two tabs daily 6)  Ciprofloxacin Hcl 500 Mg Tabs (Ciprofloxacin hcl) .... Take 1 two times a day Prescriptions: CIPROFLOXACIN HCL 500 MG TABS (CIPROFLOXACIN HCL) take 1 two times  a day  #14 x 0   Entered and Authorized by:   Gildardo Griffes FNP   Signed by:   Gildardo Griffes FNP on 07/01/2009   Method used:   [facility]  [address] [facility]       [address]       [place], [place]  [address]       Ph: [phone] or [phone]       Fax: [phone]   RxID:   [id number]   Current Allergies (reviewed today): ! * ALEVE  Laboratory Results   Urine Tests  Date/Time Received: July 01, 2009 3:28 PM  Date/Time Reported: July 01, 2009 3:28 PM   Routine Urinalysis   Color: yellow Appearance: Clear Glucose: negative   (Normal Range: Negative) Bilirubin: negative   (Normal Range: Negative) Ketone: negative   (Normal Range: Negative) Spec. Gravity: 1.010   (Normal Range: 1.003-1.035) Blood: trace-intact   (Normal Range: Negative) pH: 6.5   (Normal Range: 5.0-8.0) Protein: trace   (Normal Range: Negative) Urobilinogen: 0.2   (Normal Range: 0-1) Nitrite: negative   (Normal Range: Negative) Leukocyte Esterace: trace   (Normal Range: Negative)  Urine Microscopic WBC/HPF: 0-1 RBC/HPF: 0 Bacteria/HPF: o Epithelial/HPF: occ        Laboratory Results   Urine Tests  Routine Urinalysis   Color: yellow Appearance: Clear Glucose: negative   (Normal Range: Negative) Bilirubin: negative   (Normal Range: Negative) Ketone: negative   (Normal Range: Negative) Spec. Gravity: 1.010   (Normal Range: 1.003-1.035) Blood: trace-intact   (Normal Range: Negative) pH: 6.5   (Normal Range: 5.0-8.0) Protein: trace   (Normal Range: Negative) Urobilinogen: 0.2   (Normal Range: 0-1) Nitrite: negative   (Normal Range: Negative) Leukocyte Esterace: trace   (Normal Range: Negative)  Urine Microscopic WBC/hpf: 0-1 RBC/hpf: 0 Bacteria: o Epithelial: occ

## 2010-10-05 NOTE — Assessment & Plan Note (Signed)
Summary: INDIGRESTION,CHEST PAIN,S.O.B.FATIGUE   Vital Signs:  Patient profile:   40 year old female Height:      67.5 inches Weight:      247 pounds BMI:     38.25 Temp:     98 degrees F oral Pulse rate:   84 / minute Pulse rhythm:   regular BP sitting:   110 / 70  (right arm) Cuff size:   large  Vitals Entered By: Liane Comber CMA Duncan Dull) (May 08, 2009 8:44 AM)  History of Present Illness: is having severe indigestion- burning in throat and chest  if eats or drinks anything  shortness of breath -- with prolonged talking  has to keep taking deep cleansing breath could be anx related - "a lot going on"  is exhausted all the time is too body tired  sleeps like a rock   feels good when she first wakes up  mornings are her best time if snores ?  wt gain -- eats well most of the time  lot of stress 3 needy kids  very stressful job and family  not doing much for herself -- no time to exercise and really wants to     Allergies: 1)  ! * Aleve  Past History:  Past Surgical History: Last updated: 11/30/2008 Discectomy L5, S1 (06/2003) 3/10 bladder sling/incontinence surgery   urol-- Patsi Sears  Family History: Last updated: 05/08/2009 Father: emphysema Mother:  Siblings: brother died age 63- brain tumor, brother died age 24- leukemia MGM with heart probs  Social History: Last updated: 08/21/2008 Marital Status: Married Children: 3 Occupation: [employer] Former Smoker, > 10 years  Risk Factors: Smoking Status: quit (08/21/2008)  Past Medical History: IBS reactive airways recurrent sinusitis  obesity GERD  Family History: Father: emphysema Mother:  Siblings: brother died age 83- brain tumor, brother died age 54- leukemia MGM with heart probs  Review of Systems General:  Complains of fatigue; denies chills, fever, loss of appetite, malaise, sleep disorder, and weight loss. Eyes:  Denies blurring and eye pain. ENT:  Complains of postnasal  drainage; denies sore throat. CV:  Complains of chest pain or discomfort, fatigue, shortness of breath with exertion, and swelling of feet; denies lightheadness and palpitations; no PND  ankles stay swollen . Resp:  Complains of shortness of breath and wheezing; denies cough; occ wheezes -- ex carrying gas to lawnmover . GI:  Complains of constipation and indigestion; denies bloody stools, dark tarry stools, nausea, and vomiting. GU:  Denies discharge and dysuria; incontinence surg worked well. MS:  Denies muscle aches. Derm:  Denies lesion(s), poor wound healing, and rash. Neuro:  Denies numbness, tingling, and tremors. Psych:  Complains of anxiety; denies panic attacks, sense of great danger, and suicidal thoughts/plans. Endo:  Denies cold intolerance, excessive thirst, excessive urination, and heat intolerance. Heme:  Denies abnormal bruising and bleeding.  Physical Exam  General:  obese but well app Head:  normocephalic, atraumatic, and no abnormalities observed.   Eyes:  vision grossly intact, pupils equal, pupils round, and pupils reactive to light.  no conjunctival pallor, injection or icterus  Nose:  no nasal discharge.   Mouth:  pharynx pink and moist.   Neck:  supple with full rom and no masses or thyromegally, no JVD or carotid bruit  Chest Wall:  No deformities, masses, or tenderness noted. Lungs:  CTA with good air exch - slt distant bs no wheeze, crackles or rales  nl exp phase  Heart:  Normal rate and  regular rhythm. S1 and S2 normal without gallop, murmur, click, rub or other extra sounds. Abdomen:  mild epigastric tenderness without rebound or gaurding  soft, normal bowel sounds, no hepatomegaly, and no splenomegaly.   Msk:  No deformity or scoliosis noted of thoracic or lumbar spine.  no spinous process tenderness Pulses:  R and L carotid,radial,femoral,dorsalis pedis and posterior tibial pulses are full and equal bilaterally Extremities:  No clubbing, cyanosis, edema,  or deformity noted with normal full range of motion of all joints.   Neurologic:  sensation intact to light touch, gait normal, and DTRs symmetrical and normal.  no tremor  Skin:  Intact without suspicious lesions or rashes no pallor or jaundice  Cervical Nodes:  No lymphadenopathy noted Inguinal Nodes:  No significant adenopathy Psych:  mildly anxious  good eye contact    Impression & Recommendations:  Problem # 1:  ESOPHAGEAL REFLUX (ICD-530.81) Assessment New with heartburn, fatigue, throat clearing and occ wheeze  disc diet/ lifestyle/wt loss - given handout aafp start omeprazole 20 once daily  f/u 1-2 weeks  update if worse or side eff    Her updated medication list for this problem includes:    Prilosec 20 Mg Cpdr (Omeprazole) .Marland Kitchen... 1 by mouth once daily in am  Problem # 2:  CHEST PAIN (ICD-786.50) Assessment: New ? if secondary to above also with some sob EKG- no acute changes  check BNP/ chol- disc further at f/u adv to update if worse/seek care  Orders: Venipuncture (21308) TLB-Lipid Panel (80061-LIPID) EKG w/ Interpretation (93000)  Problem # 3:  DYSPNEA (ICD-786.05) Assessment: New with activity and speech  weight and deconditioning as well as stress could play role  also role of reflux in wheeze/sob possible  BNP today, and no acute EKG change  labs - then disc further at f/u Orders: TLB-BNP (B-Natriuretic Peptide) (83880-BNPR) EKG w/ Interpretation (93000)  Problem # 4:  FATIGUE (ICD-780.79) Assessment: New disc symptoms / habits  suspect stress/ busy lifestyle/weight could all play role lab today f/u 1-2 weeks to rev lab and disc further  Orders: Venipuncture (65784) TLB-Lipid Panel (80061-LIPID) TLB-BMP (Basic Metabolic Panel-BMET) (80048-METABOL) TLB-CBC Platelet - w/Differential (85025-CBCD) TLB-Hepatic/Liver Function Pnl (80076-HEPATIC) TLB-TSH (Thyroid Stimulating Hormone) (84443-TSH) TLB-B12 + Folate Pnl (69629_52841-L24/MWN) TLB-BNP  (B-Natriuretic Peptide) (83880-BNPR)  Problem # 5:  STRESS REACTION, ACUTE, WITH EMOTIONAL DISTURBANCE (ICD-308.0) Assessment: New disc sit stressors poss causing some anx symptoms  will need to work on sched change - to allow for self care eventually may benefit from counseling- will disc further at f/u  Complete Medication List: 1)  Topiramate 25 Mg Tabs (Topiramate) .... Take 1 tablet by mouth once a day 2)  Prilosec 20 Mg Cpdr (Omeprazole) .Marland Kitchen.. 1 by mouth once daily in am  Patient Instructions: 1)  start omeprazole one pill each am 30 min before breakfast  2)  work on stress reduction 3)  labs today  4)  if acute chest pain / shortness of breath that worsens- go to ER 5)  follow up in 1-2 week Prescriptions: PRILOSEC 20 MG CPDR (OMEPRAZOLE) 1 by mouth once daily in am  #30 x 5   Entered and Authorized by:   Judith Part MD   Signed by:   Judith Part MD on 05/08/2009   Method used:   Print then Give to Patient   RxID:   [id number]   Prior Medications (reviewed today): TOPIRAMATE 25 MG TABS (TOPIRAMATE) Take 1 tablet by mouth once a day Current Allergies (  reviewed today): ! * ALEVE Current Medications (including changes made in today's visit):  TOPIRAMATE 25 MG TABS (TOPIRAMATE) Take 1 tablet by mouth once a day PRILOSEC 20 MG CPDR (OMEPRAZOLE) 1 by mouth once daily in am    EKG  Procedure date:  05/08/2009  Findings:      NSR with rate of 75 poor R wave progression

## 2010-10-05 NOTE — Assessment & Plan Note (Signed)
Summary: 2:00B12/DLO   Nurse Visit   Allergies: 1)  ! * Aleve  Medication Administration  Injection # 1:    Medication: Vit B12 1000 mcg    Diagnosis: VITAMIN B12 DEFICIENCY (ICD-266.2)    Route: IM    Site: R deltoid    Exp Date: 08/05/2011    Lot #: [id number]    Mfr: [employer] Regent    Patient tolerated injection without complications    Given by: Delilah Shan CMA (AAMA) (February 25, 2010 2:13 PM)  Orders Added: 1)  Vit B12 1000 mcg [J3420] 2)  Admin of Therapeutic Inj  intramuscular or subcutaneous [47829]

## 2010-10-05 NOTE — Progress Notes (Signed)
Summary: Samples of Dexilant   Phone Note Call from Patient Call back at Work Phone [phone]   Caller: Patient Call For: Dr. Arlyce Dice Reason for Call: Talk to Nurse Summary of Call: needs samples of Dexilant. Initial call taken by: Karna Christmas,  December 08, 2009 12:59 PM  Follow-up for Phone Call        Given Dexilant samples # 25 -Exp.11/12 Lot #[id number] and rx. card for discount. Follow-up by: Teryl Lucy RN,  December 08, 2009 1:40 PM

## 2010-10-05 NOTE — Progress Notes (Signed)
   Phone Note Call from Patient   Summary of Call:  Given tests results and Dr.kaplan's orders. Initial call taken by: Teryl Lucy RN,  November 11, 2009 9:12 AM

## 2010-10-05 NOTE — Consult Note (Signed)
Summary: [facility]/Consultation Report/Dr. Patsi Sears   [facility]/Consultation Report/Dr. Patsi Sears   Imported By: Mickle Asper 06/19/2008 12:05:34  _____________________________________________________________________  External Attachment:    Type:   Image     Comment:   External Document

## 2010-12-01 ENCOUNTER — Emergency Department (HOSPITAL_COMMUNITY): Payer: BC Managed Care – PPO

## 2010-12-01 ENCOUNTER — Telehealth: Payer: Self-pay | Admitting: *Deleted

## 2010-12-01 ENCOUNTER — Emergency Department (HOSPITAL_COMMUNITY)
Admission: EM | Admit: 2010-12-01 | Discharge: 2010-12-01 | Disposition: A | Discharge status: Home / Self Care | Payer: BC Managed Care – PPO | Attending: Emergency Medicine | Admitting: Emergency Medicine

## 2010-12-01 DIAGNOSIS — R209 Unspecified disturbances of skin sensation: Secondary | ICD-10-CM | POA: Insufficient documentation

## 2010-12-01 DIAGNOSIS — K219 Gastro-esophageal reflux disease without esophagitis: Secondary | ICD-10-CM | POA: Insufficient documentation

## 2010-12-01 NOTE — Telephone Encounter (Signed)
Patient had been feeling light headed at work, then started having tingling and numbness down her arm. Her blood pressure in 144/97, and her pulse is 75. I advised her to go to the ER. She agreed and says that she will go to [facility].

## 2010-12-01 NOTE — Telephone Encounter (Signed)
Patient notified as instructed by telephone. Pt has already been seen at ER and was told to f/u with Dr Milinda Antis on [address] for peripheral nerve going down pt's arm. Pt scheduled appt with Dr Sharen Hones (Dr Milinda Antis did not have available appt) on Mon 12/06/10 at 2pm. Pt will call back if condition changes.

## 2010-12-01 NOTE — Telephone Encounter (Signed)
Thanks - I agree with that recommendation

## 2010-12-03 ENCOUNTER — Encounter: Payer: Self-pay | Admitting: Family Medicine

## 2010-12-06 ENCOUNTER — Ambulatory Visit (INDEPENDENT_AMBULATORY_CARE_PROVIDER_SITE_OTHER): Payer: BC Managed Care – PPO | Admitting: Family Medicine

## 2010-12-06 ENCOUNTER — Ambulatory Visit: Payer: BC Managed Care – PPO | Admitting: Family Medicine

## 2010-12-06 ENCOUNTER — Encounter: Payer: Self-pay | Admitting: Family Medicine

## 2010-12-06 VITALS — BP 118/76 | HR 84 | Temp 98.5°F | Ht 68.0 in | Wt 255.0 lb

## 2010-12-06 DIAGNOSIS — E538 Deficiency of other specified B group vitamins: Secondary | ICD-10-CM

## 2010-12-06 DIAGNOSIS — R202 Paresthesia of skin: Secondary | ICD-10-CM

## 2010-12-06 DIAGNOSIS — R209 Unspecified disturbances of skin sensation: Secondary | ICD-10-CM

## 2010-12-06 DIAGNOSIS — R200 Anesthesia of skin: Secondary | ICD-10-CM | POA: Insufficient documentation

## 2010-12-06 LAB — VITAMIN B12: Vitamin B-12: 583 pg/mL (ref 211–911)

## 2010-12-06 NOTE — Assessment & Plan Note (Addendum)
Check level today and continue current oral replacement for now.  Can restart injections if low.  I don't think this is causing her symptoms in the L hand.

## 2010-12-06 NOTE — Progress Notes (Signed)
H/o B12 def.  Due for another level.  Of injections for ~3 months.  Continues on PO replacement.    Paresthesia.  L 4-5th finger, portion of 3rd finger.  Noted at work on 12/01/10 and went to ER.  She got worried about it and sought eval.  No tumor or CVA on CT.  She types a lot at work.  H/o pain in neck, esp on L side and prev went for massage.  Dec in appetite on the prednisone but the tingling is much improved.  She had some tingling on the lateral aspect of the L arm.  R handed.  No symptoms on the right.  No other focal neuro sx.   She has looked at her workstation; has some ergonomic adjustments.  Frequently on laptop at home.   Meds, vitals, and allergies reviewed.   ROS: See HPI.  Otherwise, noncontributory.  GEN: nad, alert and oriented HEENT: mucous membranes moist NECK: supple w/o LA, slightly ttp on L cervical paraspinal area CV: rrr. PULM: ctab, no inc wob EXT: no edema SKIN: no acute rash CN 2-12 wnl B, S/S/DTR wnl x4 except for minimal numbness on L 5th fingertip.  Phalen and tinel wnl.  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

## 2010-12-06 NOTE — Assessment & Plan Note (Addendum)
Likely cervical source vs ulnar neuropathy.  Cervical source more likely.  Improved.  She'll call back if sx return/increase.  Consider MRI C spine at that point.  Fu with nerve conduction studies if MR unremarkable.  D/w pt about stretching and her workstation.  Fu prn.  She agrees.  Er recs reviewed.  Normal ROM in neck today w/o red flag sx/findings.

## 2010-12-06 NOTE — Patient Instructions (Signed)
You can get your results through our phone system.  Follow the instructions on the blue card. Keep taking the B12 by mouth.   Let us know if your symptoms get worse.  Take care.

## 2010-12-16 LAB — POCT HEMOGLOBIN-HEMACUE: Hemoglobin: 13.7 g/dL (ref 12.0–15.0)

## 2010-12-16 LAB — POCT PREGNANCY, URINE: Preg Test, Ur: NEGATIVE

## 2010-12-21 ENCOUNTER — Ambulatory Visit (INDEPENDENT_AMBULATORY_CARE_PROVIDER_SITE_OTHER): Payer: BC Managed Care – PPO | Admitting: Family Medicine

## 2010-12-21 ENCOUNTER — Telehealth: Payer: Self-pay | Admitting: *Deleted

## 2010-12-21 ENCOUNTER — Encounter: Payer: Self-pay | Admitting: Family Medicine

## 2010-12-21 VITALS — BP 110/76 | HR 64 | Temp 98.7°F | Ht 68.0 in | Wt 256.2 lb

## 2010-12-21 DIAGNOSIS — R51 Headache: Secondary | ICD-10-CM

## 2010-12-21 DIAGNOSIS — R519 Headache, unspecified: Secondary | ICD-10-CM | POA: Insufficient documentation

## 2010-12-21 DIAGNOSIS — R5383 Other fatigue: Secondary | ICD-10-CM | POA: Insufficient documentation

## 2010-12-21 DIAGNOSIS — R5381 Other malaise: Secondary | ICD-10-CM

## 2010-12-21 LAB — CBC WITH DIFFERENTIAL/PLATELET
Basophils Absolute: 0 10*3/uL (ref 0.0–0.1)
Basophils Relative: 0.3 % (ref 0.0–3.0)
Eosinophils Absolute: 0.1 10*3/uL (ref 0.0–0.7)
Eosinophils Relative: 1.7 % (ref 0.0–5.0)
HCT: 35.9 % — ABNORMAL LOW (ref 36.0–46.0)
Hemoglobin: 12.1 g/dL (ref 12.0–15.0)
Lymphocytes Relative: 20.6 % (ref 12.0–46.0)
Lymphs Abs: 1.5 10*3/uL (ref 0.7–4.0)
MCHC: 33.8 g/dL (ref 30.0–36.0)
MCV: 87.6 fl (ref 78.0–100.0)
Monocytes Absolute: 0.7 10*3/uL (ref 0.1–1.0)
Monocytes Relative: 9.1 % (ref 3.0–12.0)
Neutro Abs: 5 10*3/uL (ref 1.4–7.7)
Neutrophils Relative %: 68.3 % (ref 43.0–77.0)
Platelets: 309 10*3/uL (ref 150.0–400.0)
RBC: 4.1 Mil/uL (ref 3.87–5.11)
RDW: 13.6 % (ref 11.5–14.6)
WBC: 7.3 10*3/uL (ref 4.5–10.5)

## 2010-12-21 LAB — COMPREHENSIVE METABOLIC PANEL
ALT: 19 U/L (ref 0–35)
AST: 16 U/L (ref 0–37)
Albumin: 3.7 g/dL (ref 3.5–5.2)
Alkaline Phosphatase: 82 U/L (ref 39–117)
BUN: 12 mg/dL (ref 6–23)
CO2: 29 mEq/L (ref 19–32)
Calcium: 8.9 mg/dL (ref 8.4–10.5)
Chloride: 104 mEq/L (ref 96–112)
Creatinine, Ser: 0.7 mg/dL (ref 0.4–1.2)
GFR: 106.5 mL/min (ref >60.00)
Glucose, Bld: 82 mg/dL (ref 70–99)
Potassium: 4.1 mEq/L (ref 3.5–5.1)
Sodium: 139 mEq/L (ref 135–145)
Total Bilirubin: 0.7 mg/dL (ref 0.3–1.2)
Total Protein: 6.6 g/dL (ref 6.0–8.3)

## 2010-12-21 LAB — TSH: TSH: 1 u[IU]/mL (ref 0.35–5.50)

## 2010-12-21 MED ORDER — DEXLANSOPRAZOLE 30 MG PO CPDR: 30.0000 mg | DELAYED_RELEASE_CAPSULE | Freq: Every day | ORAL | Status: DC

## 2010-12-21 MED ORDER — TOPIRAMATE 50 MG PO TABS: 50.0000 mg | ORAL_TABLET | Freq: Two times a day (BID) | ORAL | Status: DC

## 2010-12-21 NOTE — Telephone Encounter (Signed)
Prior Berkley Harvey is needed for dexilant, form is on your shelfl.

## 2010-12-21 NOTE — Patient Instructions (Addendum)
Try dexliant instead of omeprazole  Labs for fatigue today  Start topamax 50 mg 1/2 pill twice daily for 2 weeks and then increase to 1 pill twice daily Update me if side effects  Avoid caffine Keep bed and wake times the same Motrin only when really needed  Follow up with me in about 1 month

## 2010-12-21 NOTE — Assessment & Plan Note (Signed)
Tension that has now transformed to migraine - once menstural  Will start back on topamax- see pt inst Also change ppi to dexilant - less potential for headache with that med minimalize analgesics  Lifestyle habits disc in detail F/u1 mo

## 2010-12-21 NOTE — Progress Notes (Signed)
Subjective:    Patient ID: Holly Chung, female    DOB: 04-26-1971, 40 y.o.   MRN: [id number]  HPI Here for headaches and disc of chronic med problems   Wt is stable with bmi of 38  Headaches -- started many years ago - off and on  Last Thursday night - R side of head very intense headache -- had to go to bed  Went to sleep and woke up with it - not as intense Has had headache on and off ever since - usually on R but occas L  Never had one before this bad Sharp and constant pain  Photophobia - no phonophobia  Some nausea - no vomiting  Was day 3 of period    Before thurs nt -- headaches 2-3 times per week -- pretty mild and took motrin quite often  Has never taken migraine drugs   Goes to bed 9:30- 10:00 and gets up around 5 -- ? If enough  Is exhausted all the time and has no energy  Cannot force herself to exercise Sleeps great and very deeply   peroids are regular and a little lighter -- no chance pregnant   In past had tension headaches -- saw a specialist  Saw Dr Vela Prose and that was very helpful Had topamax and also PT for neck  Has not needed it lately -- over 1-2 years   ? What dose she was on of topamax   Takes omeprazole for gerd and gastroparesis  Sees Dr Madilyn Fireman  Prevacid did not work   Past Medical History  Diagnosis Date  . Unspecified constipation   . Contact dermatitis and other eczema due to plants (except food)   . Esophageal reflux   . Other malaise and fatigue   . Gastroparesis   . Irritable bowel syndrome   . Unspecified asthma   . Unspecified sinusitis (chronic)   . Predominant disturbance of emotions   . Urinary tract infection, site not specified   . Personal history of unspecified urinary disorder   . Vitamin B12 deficiency   . Allergic rhinitis, cause unspecified     Reactive airways  . Recurrent sinusitis   . Obesity   . GERD (gastroesophageal reflux disease)   . Anemia   . Depression   . Diverticulosis    Past Surgical History    Procedure Date  . Lumbar disc surgery 06/2003    L5-S1  . Incontinence surgery 3/10  . Tonsillectomy     reports that she quit smoking about 11 years ago. Her smoking use included Cigarettes. She does not have any smokeless tobacco history on file. She reports that she does not drink alcohol or use illicit drugs. family history includes Cancer in her maternal aunt; Emphysema in her father; and Heart disease in her father and maternal grandmother. Allergies  Allergen Reactions  . Naproxen Sodium     REACTION: Hives, syncope- note that patient can tolerate ibuprofen and prednisone      Review of Systems  Constitutional: Positive for fatigue. Negative for activity change and appetite change.  Eyes: Negative for pain, redness and visual disturbance.  Respiratory: Negative for shortness of breath and wheezing.   Cardiovascular: Negative.   Gastrointestinal: Negative for abdominal pain, diarrhea and constipation.  Genitourinary: Negative for urgency and frequency.  Musculoskeletal: Negative for myalgias and joint swelling.  Skin: Negative.   Neurological: Positive for headaches. Negative for tremors, facial asymmetry, weakness and numbness.  Hematological: Negative for adenopathy. Does not bruise/bleed  easily.  Psychiatric/Behavioral: Positive for decreased concentration. Negative for suicidal ideas, sleep disturbance and dysphoric mood. The patient is not nervous/anxious.        Objective:   Physical Exam  Constitutional: She appears well-developed and well-nourished.       overwt and well appearing   HENT:  Head: Normocephalic and atraumatic.  Right Ear: External ear normal.  Left Ear: External ear normal.  Nose: Nose normal.  Mouth/Throat: Oropharynx is clear and moist.       No sinus tenderness   Eyes: Conjunctivae and EOM are normal. Pupils are equal, round, and reactive to light.  Neck: Normal range of motion. Neck supple. No JVD present. Carotid bruit is not present. No  thyromegaly present.  Cardiovascular: Normal rate, regular rhythm and normal heart sounds.   No murmur heard. Pulmonary/Chest: Effort normal and breath sounds normal.  Abdominal: Soft. Bowel sounds are normal. There is no tenderness.  Musculoskeletal: Normal range of motion. She exhibits no edema and no tenderness.  Lymphadenopathy:    She has no cervical adenopathy.  Neurological: She is alert. She has normal strength and normal reflexes. No cranial nerve deficit or sensory deficit. She displays a negative Romberg sign. Coordination normal.  Skin: Skin is warm and dry. No rash noted. No erythema. No pallor.  Psychiatric: She has a normal mood and affect.          Assessment & Plan:

## 2010-12-21 NOTE — Assessment & Plan Note (Signed)
May be multifactorial  Disc lifestyle/ meds/ ha Lab today and update

## 2010-12-21 NOTE — Telephone Encounter (Signed)
Form done  In IN box Please send copy to scan after faxing-thanks

## 2010-12-22 NOTE — Telephone Encounter (Signed)
Prior auth given for [employer], pharmacy advised.  Approval letter placed on doctor's desk for signature and scanning.

## 2010-12-22 NOTE — Telephone Encounter (Signed)
Completed form faxed to [phone] as instructed. Form given to Laurie. Copy retained at my desk. 

## 2010-12-23 ENCOUNTER — Other Ambulatory Visit: Payer: Self-pay | Admitting: Obstetrics and Gynecology

## 2010-12-23 DIAGNOSIS — Z1231 Encounter for screening mammogram for malignant neoplasm of breast: Secondary | ICD-10-CM

## 2010-12-24 ENCOUNTER — Telehealth: Payer: Self-pay | Admitting: *Deleted

## 2010-12-24 NOTE — Telephone Encounter (Signed)
Let's more rapidly increase dose if she is not having side effects Confirm she is taking 1/2 of 50 mg once daily  If so - increase now to 1/2 bid If she is ok on Monday-- go up to 1/2 in am and 1 in pm In 1 week if Heidt no side effects increase to 1 whole pill bid  Update me then -- sometimes it takes a while to get a response - and we were starting slow

## 2010-12-24 NOTE — Telephone Encounter (Signed)
Patient notified as instructed by telephone. Pt is taking 1/2 bid. So Dr Milinda Antis said to continue instructions from that point.

## 2010-12-24 NOTE — Telephone Encounter (Signed)
Pt is taking one half topamax everyday, but she says this is not helping.  She says she continues to have headaches every day.  She is asking if there is something else that she can try.  Uses [facility] on [address]

## 2011-01-06 ENCOUNTER — Ambulatory Visit
Admission: RE | Admit: 2011-01-06 | Discharge: 2011-01-06 | Disposition: A | Discharge status: Home / Self Care | Payer: BC Managed Care – PPO | Source: Ambulatory Visit | Attending: Obstetrics and Gynecology | Admitting: Obstetrics and Gynecology

## 2011-01-06 DIAGNOSIS — Z1231 Encounter for screening mammogram for malignant neoplasm of breast: Secondary | ICD-10-CM

## 2011-01-18 NOTE — Op Note (Signed)
NAME:  Holly Chung, Holly Chung                  ACCOUNT NO.:  [id number]   MEDICAL RECORD NO.:  [id number]          PATIENT TYPE:  AMB   LOCATION:  [facility]                         FACILITY:  [facility]   PHYSICIAN:  Sigmund I. Tannenbaum, M.D.DATE OF BIRTH:  10-Jun-1971   DATE OF PROCEDURE:  11/27/2008  DATE OF DISCHARGE:                               OPERATIVE REPORT   PREOPERATIVE DIAGNOSIS:  Stress incontinence, grade 3 cystocele with  pelvic prolapse.   POSTOPERATIVE DIAGNOSIS:  Stress incontinence, grade 3 cystocele with  pelvic prolapse.   OPERATION:  Transvaginal uphold pelvic floor apical repair with mesh  insertion, and Solyx transurethral sling.   SURGEON:  Sigmund I. Patsi Sears, MD.   ANESTHESIA:  General LMA.   PREPARATION:  After appropriate preanesthesia, the patient was brought  to the operating room, placed upon the operating room dorsal supine  position where general LMA anesthesia was introduced.  She was then  replaced in the dorsal lithotomy position where the pubis was prepped  with Betadine solution and draped in the usual fashion.   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  She has never had a  hysterectomy, and has and is para 2-2-0.  She has a history of  diskectomy at L4-L5, with bulging disk at L2-L3.  Urodynamics was  accomplished, and shows some bladder instability, and a leak point  pressure of 77 cm of water at 300 mL with moderate leakage.  Because of  her cystocele, we have arranged for the pelvic floor repair, and also  pubovaginal sling.  I have considered her for both a [place] pubovaginal  sling and a Solyx pubovaginal sling.  However, with a fairly reasonable  leak point pressure, and the uphold in place (with 25% contraction), I  think that she will do well with the Solyx sling, and not have to have  the extra  surgical intervention of the [facility] Nov[facility] retropubic sling.   PROCEDURE:  Vaginal inspection reveals apical descent with uterus in  place.  A marking pen is used to mark out the area of incision, and 12  mL of Marcaine 0.5% with epinephrine 1:200,000 is injected in the  proposed incision site.   A semilunar incision is then made and subcutaneous tissue dissected to  the level of the cervix.  The pelvic floor was then dissected  bilaterally, and the ischial spine is identified bilaterally.  The  ligament is then identified.  A small Kelly plication is accomplished  with horizontal mattress 2-0 Vicryl suture.  Finger dissection is then  accomplished to show the ischial spine, and using the Cappio device,  sutures were placed in the sacrospinous ligament bilaterally.  The  uphold mesh is then secured in position, by placing three track tacking  sutures around the cervix, and also distalward, so that there was no  folding of the mesh.  Mesh was further placed, and the wings of the mesh  were incised in a standard fashion.  Copious irrigation with antibiotic  irrigation was accomplished.  Prior to the procedure, it was noted that  the patient had cystoscopy, bilateral retrograde pyelogram with  interpretation, and bilateral ureteral stents, in order to protect the  ureters during the uphold procedure.   Following placement of the uphold of mesh, and after irrigation, the  wound was closed with multiple running 2-0 Vicryl sutures.  No vaginal  tissue was excised, because of the patient's young age and continued  sexuality.   Attention was then directed to the urethra, where I elected to place a  Solyx sling, instead of a [place] retropubic sling.  A 1.5 cm incision is  made in the midline of the urethra, subcutaneous tissue dissected in the  midportion of the urethra around the Foley catheter to the level of the  pubis.  The Solyx sling was then placed on the right side first, past  the midline,  which was marked with a marking pen.  This went easily into  the obturator internus muscle, and was released and secured.  The second  side was then placed into the obturator internus on the left side, in  the same manner, by tracking the mesh to the level of the pubis, going  behind the pubis, and placing in the obturator internus muscle and  releasing.  Inspection showed a smooth texture of the mesh on the  urethra, and pillowing of the periurethral tissue through the mesh.  The wound was then closed.  Wound was irrigated with antibiotic  irrigation, and a vaginal packing was placed.  Cystoscopy showed indigo  carmine, which had previously been given, to come through both ureters.  The ureteral stents had been removed.  Foley catheter was replaced, and  packing was secured, the patient was awakened and taken to the recovery  room in good condition.  signed 3/25 @ 4pm      Sigmund I. Patsi Sears, M.D.  Electronically Signed     SIT/MEDQ  D:  11/27/2008  T:  11/27/2008  Job:  [id number]   cc:   Marne A. Tower, MD  [address], [place] [address]

## 2011-01-21 NOTE — H&P (Signed)
[facility]  Patient:    Holly Chung, Holly Chung                         MRN: [id number] Adm. Date:  [id number] Attending:  Lenoard Aden                         History and Physical  CHIEF COMPLAINT:              History of precipitous labor with advanced dilatation for induction.  HISTORY OF PRESENT ILLNESS:   A 40 year old white female, G2, P37, EDD August 21, 2000, at 39 weeks who presents with aforementioned history of precipitous labor and advanced dilatation for induction.  Pregnancy complicated by severe first-trimester hyperemesis and history of advanced dilatation at term.  PAST MEDICAL HISTORY:         Remarkable for allergies to Aleve.  History of uncomplicated vaginal delivery in 1996.  Pregnancy previously complicated by oligohydramnios.  Past medical history remarkable for frequent UTIs, anemia, and irritable bowel syndrome.  MEDICATIONS:                  Prenatal vitamins and iron.  FAMILY HISTORY:               Emphysema, leukemia, hypertension, and diabetes.  SOCIAL HISTORY:               Noncontributory.  Patient is a nonsmoker, nondrinker.  Denies domestic or physical violence.  PHYSICAL EXAMINATION:  VITAL SIGNS:                  Patient is a well-developed, well-nourished white female in no apparent distress.  HEENT:                        Normal.  LUNGS:                        Clear.  HEART:                        Regular rhythm.  ABDOMEN:                      Soft, gravid, nontender.  Estimated fetal weight is 7-1/2 to 8 pounds.  Cervix is 4+ cm, 70% effaced, soft, vertex, and -1. Artificial rupture of membranes clear.  EXTREMITIES:                  Reveal no cords.  NEUROLOGIC:                   Exam is nonfocal.  IMPRESSION:                   1. A 39-week intrauterine pregnancy.                               2. A history of precipitous labor with advanced                                  dilatation and increased  geographic distance  from hospital.  PLAN:                         Proceed with induction. DD:  08/14/00 TD:  08/14/00 Job: [id number]

## 2011-01-21 NOTE — Assessment & Plan Note (Signed)
[facility]                                 ON-CALL NOTE   NAME:Shutters, TEKEISHA HAKIM                         MRN:          [id number]  DATE:10/13/2006                            DOB:          Apr 22, 1971    TELEPHONE NUMBER:  [phone]   PHYSICIAN:  Dr. Milinda Antis.   TIME OF CALL:  5:30pm.   The caller is stating that Dr. Milinda Antis had said she would call her in  antibiotics, but nothing has been called in. She has called the office  twice and was reassured her medicine will be called in. Her husband has  gone to the store twice and it is not there.   I called Dr. Milinda Antis at the office and she is [place] there. She will  resolve the issue.     Jeffrey A. Tawanna Cooler, MD  Electronically Signed    JAT/MedQ  DD: 10/13/2006  DT: 10/14/2006  Job #: [id number]

## 2011-01-21 NOTE — Op Note (Signed)
NAME:  Holly Chung, Holly Chung                  ACCOUNT NO.:  [id number]   MEDICAL RECORD NO.:  [id number]          PATIENT TYPE:  AMB   LOCATION:  [facility]                           FACILITY:  [facility]   PHYSICIAN:  Lenoard Aden, M.D.DATE OF BIRTH:  Jan 17, 1971   DATE OF PROCEDURE:  02/04/2005  DATE OF DISCHARGE:                                 OPERATIVE REPORT   PREOPERATIVE DIAGNOSIS:  Menorrhagia refractory to medical therapy.   POSTOPERATIVE DIAGNOSIS:  Menorrhagia refractory to medical therapy.   PROCEDURE:  1.  Diagnostic hysteroscopy.  2.  D&C.  3.  NovaSure endometrial ablation.   SURGEON:  Dr. Billy Coast   ANESTHESIA:  General.   ESTIMATED BLOOD LOSS:  Less than 50 mL.   COMPLICATIONS:  None.   FLUID DEFICIT:  50 mL noted.   Patient to recovery in good condition.   BRIEF OPERATIVE NOTE:  After being apprised of the risks of anesthesia,  infection, bleeding, uterine perforation with possible injury to bowel and  bladder with need for repair, possible laparoscopy, the patient is brought  to the operating room where she is administered a general anesthetic with no  complications, prepped and draped in the usual sterile fashion, catheterized  until the bladder is empty.  Exam reveals an anteflexed uterus and no  adnexal masses.  Intracervical length of 3 cm.  Uterine sounds to 9 cm.  Cervix is easily dilated up to a #25 Pratt dilator, hysteroscope placed.  Visualization reveals no evidence of structural lesions, bilateral tubal  ostia visualized normally.  At this time, D&C is performed.  Endometrial  curettings are collected in a normal 4 quadrant method and sent for  pathology for pathologic confirmation.  Revisualization hysteroscopy reveals  continued normal cavity at this time.  NovaSure device is placed without  difficulty and seated.  CO2 test is normal.  At this time, procedure is  initiated.  Cavity with 2.3 cm noted.  Procedure is done without difficulty.  At this time,  revisualization by hysteroscopy reveals a well-ablated cavity  and no evidence of perforation.  The patient tolerates the procedure well  and is awakened and transferred to recovery room in good condition.      RJT/MEDQ  D:  02/04/2005  T:  02/04/2005  Job:  [id number]

## 2011-01-21 NOTE — Op Note (Signed)
   NAME:  Holly Chung, Holly Chung                            ACCOUNT NO.:  [id number]   MEDICAL RECORD NO.:  [id number]                   PATIENT TYPE:  OIB   LOCATION:  2550                                 FACILITY:  [facility]   PHYSICIAN:  Georges Lynch. Gioffre, M.D.             DATE OF BIRTH:  13-Dec-1970   DATE OF PROCEDURE:  07/01/2003  DATE OF DISCHARGE:                                 OPERATIVE REPORT   PREOPERATIVE DIAGNOSIS:  Herniated lumbar disk at L5-S1 on the left.   POSTOPERATIVE DIAGNOSIS:  Herniated lumbar disk at L5-S1 on the left.   OPERATION PERFORMED:  Hemilaminectomy and microdiskectomy at L5-S1 on the  left.   SURGEON:  Georges Lynch. [name], M.D.   ASSISTANT:  Kerrin Champagne, M.D.   ANESTHESIA:  General.   DESCRIPTION OF PROCEDURE:  Under general anesthesia, the patient in a spinal  frame, routine orthopedic prepping and draping of the lower back was carried  out.  Two needles were placed in the back for x-ray purposes.  X-ray was  taken to verify the position.  We then made an incision over L5-S1.  Bleeders identified and cauterized.  The incision was carried down to the  spinous process and lamina of L5 and S1.  Another x-ray was taken to verify  our position and a radiologist documented that we were definitely in the L5-  S1 space.  We then went down and carried out a hemilaminectomy.  I then  removed the ligamentum flavum.  The microscope was brought in during the  procedure and we identified the dura, gently retracted the dura, cauterized  the lateral recess veins with a bipolar and then made a cruciate incision in  the posterior longitudinal ligament and did a complete microdiskectomy.  We  were then easily able to pass a hockey stick out the foramina for the S1  root and there was no other tension noted on the dura or the root.  We made  multiple passes into the disk space.  We thoroughly irrigated out the area.  We loosely applied the thrombin soaked Gelfoam and the wound  was closed in  layers in the usual fashion.  Skin was closed with metal staples and a  sterile Neosporin dressing was applied.  She had 1g of IV Ancef preop.                                                Ronald A. [name], M.D.    RAG/MEDQ  D:  07/01/2003  T:  07/01/2003  Job:  [id number]

## 2011-01-26 ENCOUNTER — Encounter: Payer: Self-pay | Admitting: Family Medicine

## 2011-01-26 ENCOUNTER — Ambulatory Visit (INDEPENDENT_AMBULATORY_CARE_PROVIDER_SITE_OTHER): Payer: BC Managed Care – PPO | Admitting: Family Medicine

## 2011-01-26 DIAGNOSIS — R5381 Other malaise: Secondary | ICD-10-CM

## 2011-01-26 DIAGNOSIS — R5383 Other fatigue: Secondary | ICD-10-CM

## 2011-01-26 DIAGNOSIS — R51 Headache: Secondary | ICD-10-CM

## 2011-01-26 NOTE — Progress Notes (Signed)
Subjective:    Patient ID: Holly Chung, female    DOB: May 30, 1971, 40 y.o.   MRN: [id number]  HPI Here for f/u of headaches on topamax Did not respond to 25 so inc gradually to 50 mg bid  Lost 5 lb  Has been on topamax before  Was briefly depressed - but that passed  Some brain fog -- worse than previously  Also very outspoken -- less laid back than she used to be -- not a good thing   Last month - 5 days without headaches Better this month  Really good last few days   Sleep varies  Usually 9:30-10 to 5:30  Is really tired  Does not think she has apnea  ? Snoring   caffiene - tea in the ams 32 oz and water the rest of the day      Changed to dexliant for gerd (for less ha potential ) - works really well   Lab for fatigue were ok   Work gets hairy in the fall  More work at home   Past Medical History  Diagnosis Date  . Unspecified constipation   . Contact dermatitis and other eczema due to plants (except food)   . Esophageal reflux   . Other malaise and fatigue   . Gastroparesis   . Irritable bowel syndrome   . Unspecified asthma   . Unspecified sinusitis (chronic)   . Predominant disturbance of emotions   . Urinary tract infection, site not specified   . Personal history of unspecified urinary disorder   . Vitamin B12 deficiency   . Allergic rhinitis, cause unspecified     Reactive airways  . Recurrent sinusitis   . Obesity   . GERD (gastroesophageal reflux disease)   . Anemia   . Depression   . Diverticulosis     History   Social History  . Marital Status: Married    Spouse Name: N/A    Number of Children: 3  . Years of Education: N/A   Occupational History  . [employer]    Social History Main Topics  . Smoking status: Former Smoker    Types: Cigarettes    Quit date: 09/06/1999  . Smokeless tobacco: Not on file  . Alcohol Use: No  . Drug Use: No  . Sexually Active: Not on file   Other Topics Concern  . Not on file   Social History  Narrative   Daily caffeine use:  Tea in the morning.        Review of Systems Review of Systems  Constitutional: Negative for fever, appetite change, fatigue and unexpected weight change.  Eyes: Negative for pain and visual disturbance.  Respiratory: Negative for cough and pos for sob on exertion (starting exercise program and very deconditioned) Cardiovascular: Negative.for cp or edema    Gastrointestinal: Negative for nausea, diarrhea and constipation.  Genitourinary: Negative for urgency and frequency.  Skin: Negative for pallor. or rash  Neurological: Negative for weakness, light-headedness, numbness and headaches. pos for difficulty concentrating Hematological: Negative for adenopathy. Does not bruise/bleed easily.  Psychiatric/Behavioral: Negative for dysphoric mood. The patient is not nervous/anxious.          Objective:   Physical Exam  Constitutional: She appears well-developed and well-nourished. No distress.       overwt and well appearing   HENT:  Head: Normocephalic and atraumatic.       No sinus headaches   Eyes: Conjunctivae and EOM are normal. Pupils are equal, round,  and reactive to light.  Neck: Normal range of motion. Neck supple. No JVD present. No thyromegaly present.  Cardiovascular: Normal rate, regular rhythm and normal heart sounds.   Pulmonary/Chest: Effort normal and breath sounds normal. No respiratory distress. She has no wheezes.  Musculoskeletal: She exhibits no edema and no tenderness.  Lymphadenopathy:    She has no cervical adenopathy.  Neurological: She is alert. She has normal strength and normal reflexes. No cranial nerve deficit. Coordination and gait normal.  Psychiatric: She has a normal mood and affect. Thought content normal.          Assessment & Plan:

## 2011-01-26 NOTE — Assessment & Plan Note (Signed)
Chronic daily headache is starting to respond to topamax But side effects of brain fog are bothering pt  May be improving -pt will try this for a little while longer If no further imp - consider trying gabapentin  Enc no caffiene, regular sleep (? Consider poss sleep apnea) , and more regular exercise (slow advancement on treadmill) She is loosing weight - which is helpful also

## 2011-01-26 NOTE — Patient Instructions (Signed)
Work on plan for more sleep if possible Ask husband about snoring or signs of sleep apnea Change tea to decaf  Try to very slowly increase exercise  Think about topamax vs trying gabapentin (neurontin)  -- and let me know if you want to change at any time  Follow up in 2 months please

## 2011-01-26 NOTE — Assessment & Plan Note (Signed)
This is ongoing - rev labs which were reassuring  Because she wakes up tired and falls asleep easily - suspect she either needs more sleep or ? Less possible - apnea She will ask husband if she has snoring or apnea symptoms

## 2011-02-14 ENCOUNTER — Other Ambulatory Visit: Payer: Self-pay | Admitting: Obstetrics and Gynecology

## 2011-03-02 ENCOUNTER — Telehealth: Payer: Self-pay | Admitting: *Deleted

## 2011-03-02 NOTE — Telephone Encounter (Signed)
Stop the topamax  Then update me with how many headaches she is getting per week - if they are frequent, then rebound headaches with prn tx is possible  Will make a plan after she updates me Please remind her I'm out of town and just checking messages occasionally

## 2011-03-02 NOTE — Telephone Encounter (Signed)
Patient notified as instructed by telephone. 

## 2011-03-02 NOTE — Telephone Encounter (Signed)
Patient says that she would really like to stop taking the topamax because she doesn't like the way it make her feel. She feels like she can hardly function. She says that she would like to be able to just have something on hand to take when she does get a headache instead of taking a preventative. Uses [facility] on [address].

## 2011-03-30 ENCOUNTER — Encounter: Payer: Self-pay | Admitting: Family Medicine

## 2011-03-30 ENCOUNTER — Ambulatory Visit (INDEPENDENT_AMBULATORY_CARE_PROVIDER_SITE_OTHER): Payer: BC Managed Care – PPO | Admitting: Family Medicine

## 2011-03-30 DIAGNOSIS — R51 Headache: Secondary | ICD-10-CM

## 2011-03-30 DIAGNOSIS — R5381 Other malaise: Secondary | ICD-10-CM

## 2011-03-30 DIAGNOSIS — R5383 Other fatigue: Secondary | ICD-10-CM

## 2011-03-30 MED ORDER — SUMATRIPTAN SUCCINATE 100 MG PO TABS: 100.0000 mg | ORAL_TABLET | Freq: Once | ORAL | Status: DC | PRN

## 2011-03-30 NOTE — Progress Notes (Signed)
Subjective:    Patient ID: Holly Chung, female    DOB: 01-30-1971, 40 y.o.   MRN: [id number]  HPI Here for f/u of chronic migraine This was fairly controlled with topamax 50 mg bid but side eff forced her to stop it in June   Overall headaches are much better despite being off the med   Started menses this mo 1 week early and had 2 d of headaches  Last 3 days also -- one on Monday was bad with nausea and had to go bed  Is done with menses   Has taken ES tylenol -- does not help a lot  Never tried a trypan   Not having headaches every day   Is caffiene free- proud of that  Also no motrin  Sleep - doing better  If stays up to late -- ha the next day  Husband watched her for apnea - no signs of it    Wt is down 7 lb-- is cutting back on amt and eating earlier Was exercising a lot - last 3 weeks too busy  Was better then   Patient Active Problem List  Diagnoses  . VITAMIN B12 DEFICIENCY  . STRESS REACTION, ACUTE, WITH EMOTIONAL DISTURBANCE  . SINUSITIS, RECURRENT  . VASOMOTOR RHINITIS  . REACTIVE AIRWAY DISEASE  . ESOPHAGEAL REFLUX  . GASTROPARESIS  . CONSTIPATION, INTERMITTENT  . IBS  . CONTACT DERMATITIS&OTHER ECZEMA DUE TO PLANTS  . FATIGUE  . UTI'S, HX OF  . Numbness and tingling in left hand  . Fatigue  . Headache   Past Medical History  Diagnosis Date  . Unspecified constipation   . Contact dermatitis and other eczema due to plants (except food)   . Esophageal reflux   . Other malaise and fatigue   . Gastroparesis   . Irritable bowel syndrome   . Unspecified asthma   . Unspecified sinusitis (chronic)   . Predominant disturbance of emotions   . Urinary tract infection, site not specified   . Personal history of unspecified urinary disorder   . Vitamin B12 deficiency   . Allergic rhinitis, cause unspecified     Reactive airways  . Recurrent sinusitis   . Obesity   . GERD (gastroesophageal reflux disease)   . Anemia   . Depression   .  Diverticulosis    Past Surgical History  Procedure Date  . Lumbar disc surgery 06/2003    L5-S1  . Incontinence surgery 3/10  . Tonsillectomy    History  Substance Use Topics  . Smoking status: Former Smoker    Types: Cigarettes    Quit date: 09/06/1999  . Smokeless tobacco: Not on file  . Alcohol Use: No   Family History  Problem Relation Age of Onset  . Emphysema Father   . Heart disease Father   . Cancer Maternal Aunt     Colon  . Heart disease Maternal Grandmother    Allergies  Allergen Reactions  . Naproxen Sodium     REACTION: Hives, syncope- note that patient can tolerate ibuprofen and prednisone   Current Outpatient Prescriptions on File Prior to Visit  Medication Sig Dispense Refill  . B Complex Vitamins (VITAMIN B COMPLEX PO) Take by mouth daily.        Marland Kitchen Dexlansoprazole 30 MG capsule Take 30 mg by mouth daily.        . vitamin B-12 (CYANOCOBALAMIN) 500 MCG tablet Take 2 tablets by mouth daily.       Marland Kitchen ALPRAZolam (  XANAX) 0.5 MG tablet Take 0.5 mg by mouth at bedtime as needed. For severe anxiety.       . cyanocobalamin (,VITAMIN B-12,) 1000 MCG/ML injection Inject 1,000 mcg into the muscle every 30 (thirty) days.        . hyoscyamine (LEVSIN SL) 0.125 MG SL tablet Place 2 tabs sublinqual every 4 hours as needed.             Review of Systems Review of Systems  Constitutional: Negative for fever, appetite change, fatigue and unexpected weight change.  Eyes: Negative for pain and visual disturbance.  Respiratory: Negative for cough and shortness of breath.   Cardiovascular: Negative.  for cp or sob  Gastrointestinal: Negative for nausea, diarrhea and constipation.  Genitourinary: Negative for urgency and frequency.  Skin: Negative for pallor. or rash  Neurological: Negative for weakness, light-headedness, numbness and pos for ha (also had mental fuzziness on topamax that is much better now)   Hematological: Negative for adenopathy. Does not bruise/bleed  easily.  Psychiatric/Behavioral: Negative for dysphoric mood. The patient is not nervous/anxious.  - stress is a little better         Objective:   Physical Exam  Constitutional: She appears well-developed and well-nourished. No distress.       overwt and well appearing   HENT:  Head: Normocephalic and atraumatic.  Right Ear: External ear normal.  Left Ear: External ear normal.  Nose: Nose normal.  Mouth/Throat: Oropharynx is clear and moist.       No sinus tenderness  Eyes: Conjunctivae and EOM are normal. Pupils are equal, round, and reactive to light.       No temporal tenderness  Neck: Normal range of motion. Neck supple. No JVD present. Carotid bruit is not present. No thyromegaly present.  Cardiovascular: Normal rate, regular rhythm, normal heart sounds and intact distal pulses.   No murmur heard. Pulmonary/Chest: Effort normal and breath sounds normal. No respiratory distress. She has no wheezes.  Abdominal: Soft. Bowel sounds are normal.  Musculoskeletal: She exhibits no edema.       No acute joint changes   Lymphadenopathy:    She has no cervical adenopathy.  Neurological: She is alert. She has normal strength and normal reflexes. No cranial nerve deficit or sensory deficit. She displays a negative Romberg sign. Coordination and gait normal.  Skin: Skin is warm and dry. No rash noted. No erythema. No pallor.  Psychiatric: She has a normal mood and affect.       Cheerful and less fatigued today          Assessment & Plan:

## 2011-03-30 NOTE — Assessment & Plan Note (Signed)
topamax worked for prevention but had to stop due to side effects  Overall now with lifestyle change - only having ha during menses Given imitrex (disc side eff) for acute migraine to try  Enc to continue lifestyle change- see inst  F/u prn  Update if the tryptan does not work

## 2011-03-30 NOTE — Patient Instructions (Signed)
Try imitrex for migraine as needed  No more than one in a day  Keep up fluids/avoid caffiene/ mind your bedtime and get back to exercise Update me if imitrex does not work

## 2011-03-30 NOTE — Assessment & Plan Note (Signed)
Improved with lifestyle change/ exercise/ better sleep  Also less headaches and perhaps less stress This is reassuring

## 2011-09-13 ENCOUNTER — Encounter: Payer: Self-pay | Admitting: Family Medicine

## 2011-09-13 ENCOUNTER — Ambulatory Visit (INDEPENDENT_AMBULATORY_CARE_PROVIDER_SITE_OTHER): Payer: BC Managed Care – PPO | Admitting: Family Medicine

## 2011-09-13 DIAGNOSIS — R1110 Vomiting, unspecified: Secondary | ICD-10-CM

## 2011-09-13 MED ORDER — ONDANSETRON HCL 4 MG PO TABS: 4.0000 mg | ORAL_TABLET | Freq: Three times a day (TID) | ORAL | Status: AC | PRN

## 2011-09-13 NOTE — Progress Notes (Signed)
duration of symptoms: since yesterday Some nausea yesterday, vomiting yesterday with HA and fever later in the day.   Vomitus = phlegm, nonbloody 1 loose stool yesterday Fever this AM Rhinorrhea: no Congestion: yes ear pain: yes, prev with R ear pain, but resolved now sore throat:  Prev, resolved Cough: no Myalgias: yes +sick contacts at work No focal abd pain, but diffusely achy Normal UOP this AM.   ROS: See HPI.  Otherwise negative.    Meds, vitals, and allergies reviewed.   GEN: nad, alert and oriented HEENT: mucous membranes moist, TM w/o erythema, nasal epithelium minimally injected, OP with mild cobblestoning NECK: supple w/o LA CV: rrr. PULM: ctab, no inc wob ABD: soft, +bs, no rebound, no masses EXT: no edema

## 2011-09-13 NOTE — Patient Instructions (Signed)
Take tylenol for fever, zofran for nausea, and drink plenty of fluids.  This should gradually improve.  Take care.

## 2011-09-15 DIAGNOSIS — R1110 Vomiting, unspecified: Secondary | ICD-10-CM | POA: Insufficient documentation

## 2011-09-15 NOTE — Assessment & Plan Note (Signed)
Likely viral AGE, benign exam, supportive tx with zofran and tylenol.  F/u prn.  Nontoxic.  D/w pt.

## 2011-09-16 ENCOUNTER — Telehealth: Payer: Self-pay | Admitting: Internal Medicine

## 2011-09-16 MED ORDER — AMOXICILLIN 500 MG PO CAPS: 500.0000 mg | ORAL_CAPSULE | Freq: Three times a day (TID) | ORAL | Status: AC

## 2011-09-16 NOTE — Telephone Encounter (Signed)
Patient notified as instructed by telephone. Pt said vomiting has completely cleared.

## 2011-09-16 NOTE — Telephone Encounter (Signed)
I read her note - saw Dr D - I feel comfortable treating her for sinusitis Make sure vomiting is better please  Will refill electronically - for amoxicillin  Update if not starting to improve in a week or if worsening

## 2011-09-16 NOTE — Telephone Encounter (Signed)
Patient notified as instructed by telephone cell v/m due to Friday at 5:30pm. Requested pt to call back on Monday to verify she got the v/m message.

## 2011-09-16 NOTE — Telephone Encounter (Signed)
Patient called and stated she has a sinus infection from the virus.  Her nose and face are very tight, no fever but she is coughing and blowing yellow mucous.  She is wondering if you can call in something for her or does she have to be seen again.  She was seen on 09/12/10.  Please advise.   [facility].

## 2011-12-16 ENCOUNTER — Other Ambulatory Visit: Payer: Self-pay | Admitting: Obstetrics and Gynecology

## 2011-12-16 DIAGNOSIS — Z1231 Encounter for screening mammogram for malignant neoplasm of breast: Secondary | ICD-10-CM

## 2012-01-10 ENCOUNTER — Ambulatory Visit
Admission: RE | Admit: 2012-01-10 | Discharge: 2012-01-10 | Disposition: A | Discharge status: Home / Self Care | Payer: BC Managed Care – PPO | Source: Ambulatory Visit | Attending: Obstetrics and Gynecology | Admitting: Obstetrics and Gynecology

## 2012-01-10 DIAGNOSIS — Z1231 Encounter for screening mammogram for malignant neoplasm of breast: Secondary | ICD-10-CM

## 2012-10-16 ENCOUNTER — Other Ambulatory Visit: Payer: Self-pay | Admitting: Dermatology

## 2012-10-31 ENCOUNTER — Encounter: Payer: Self-pay | Admitting: Gastroenterology

## 2012-10-31 ENCOUNTER — Ambulatory Visit (INDEPENDENT_AMBULATORY_CARE_PROVIDER_SITE_OTHER): Payer: BC Managed Care – PPO | Admitting: Gastroenterology

## 2012-10-31 VITALS — BP 128/74 | HR 80 | Ht 68.0 in | Wt 242.0 lb

## 2012-10-31 DIAGNOSIS — K3184 Gastroparesis: Secondary | ICD-10-CM

## 2012-10-31 DIAGNOSIS — K5900 Constipation, unspecified: Secondary | ICD-10-CM

## 2012-10-31 DIAGNOSIS — K219 Gastro-esophageal reflux disease without esophagitis: Secondary | ICD-10-CM

## 2012-10-31 MED ORDER — DEXLANSOPRAZOLE 60 MG PO CPDR: 60.0000 mg | DELAYED_RELEASE_CAPSULE | Freq: Every day | ORAL | Status: DC

## 2012-10-31 NOTE — Assessment & Plan Note (Signed)
Currently she is asymptomatic except to the extent that it may be contributing to reflux. Plan no further therapy at this time.

## 2012-10-31 NOTE — Progress Notes (Signed)
History of Present Illness: The patient has returned for followup of reflux. On dexilant reflux symptoms including pyrosis and regurgitation are well controlled. She ran out of medicines about a year ago and now is having frequent symptoms again. She denies dysphagia or nausea. She also complains of intermittent constipation. She has gastroparesis as determined by gastric emptying scan in 2011.    Past Medical History  Diagnosis Date  . Unspecified constipation   . Contact dermatitis and other eczema due to plants (except food)   . Esophageal reflux   . Other malaise and fatigue   . Gastroparesis   . Irritable bowel syndrome   . Unspecified asthma   . Unspecified sinusitis (chronic)   . Predominant disturbance of emotions   . Urinary tract infection, site not specified   . Personal history of unspecified urinary disorder   . Vitamin B12 deficiency   . Allergic rhinitis, cause unspecified     Reactive airways  . Recurrent sinusitis   . Obesity   . GERD (gastroesophageal reflux disease)   . Anemia   . Depression   . Diverticulosis    Past Surgical History  Procedure Laterality Date  . Lumbar disc surgery  06/2003    L5-S1  . Incontinence surgery  3/10  . Tonsillectomy     family history includes Cancer in her maternal aunt; Emphysema in her father; and Heart disease in her father and maternal grandmother. Current Outpatient Prescriptions  Medication Sig Dispense Refill  . AMBULATORY NON FORMULARY MEDICATION DGL ULTRA  2 CHEWABLE TABLETS AT NIGHT      . B Complex Vitamins (VITAMIN B COMPLEX PO) Take by mouth daily.        . calcium carbonate (TUMS - DOSED IN MG ELEMENTAL CALCIUM) 500 MG chewable tablet Chew 1 tablet by mouth 3 (three) times daily.      . phentermine 37.5 MG capsule Take 37.5 mg by mouth every morning.       No current facility-administered medications for this visit.   Allergies as of 10/31/2012 - Review Complete 10/31/2012  Allergen Reaction Noted  .  Naproxen sodium  02/13/2008    reports that she quit smoking about 13 years ago. Her smoking use included Cigarettes. She smoked 0.00 packs per day. She has never used smokeless tobacco. She reports that she does not drink alcohol or use illicit drugs.     Review of Systems: Pertinent positive and negative review of systems were noted in the above HPI section. All other review of systems were otherwise negative.  Vital signs were reviewed in today's medical record Physical Exam: General: Well developed , well nourished, no acute distress Skin: anicteric Head: Normocephalic and atraumatic Eyes:  sclerae anicteric, EOMI Ears: Normal auditory acuity Mouth: No deformity or lesions Neck: Supple, no masses or thyromegaly Lungs: Clear throughout to auscultation Heart: Regular rate and rhythm; no murmurs, rubs or bruits Abdomen: Soft, non tender and non distended. No masses, hepatosplenomegaly or hernias noted. Normal Bowel sounds. There is no succussion splash Rectal:deferred Musculoskeletal: Symmetrical with no gross deformities  Skin: No lesions on visible extremities Pulses:  Normal pulses noted Extremities: No clubbing, cyanosis, edema or deformities noted Neurological: Alert oriented x 4, grossly nonfocal Cervical Nodes:  No significant cervical adenopathy Inguinal Nodes: No significant inguinal adenopathy Psychological:  Alert and cooperative. Normal mood and affect

## 2012-10-31 NOTE — Assessment & Plan Note (Signed)
Recommended fiber supplementation.

## 2012-10-31 NOTE — Assessment & Plan Note (Addendum)
Patient is symptomatic again off PPI therapy. She reports an excellent response with dexilant alone in the past. Plan to renew  medication

## 2012-10-31 NOTE — Patient Instructions (Addendum)
Follow up as needed   Your medication has been sent to [facility] for a 90 days supply

## 2012-11-15 ENCOUNTER — Telehealth: Payer: Self-pay | Admitting: *Deleted

## 2012-11-15 NOTE — Telephone Encounter (Signed)
Patient approved for dexilant today

## 2013-02-26 ENCOUNTER — Other Ambulatory Visit: Payer: Self-pay

## 2013-02-26 DIAGNOSIS — Z1231 Encounter for screening mammogram for malignant neoplasm of breast: Secondary | ICD-10-CM

## 2013-03-04 ENCOUNTER — Other Ambulatory Visit (INDEPENDENT_AMBULATORY_CARE_PROVIDER_SITE_OTHER): Payer: BC Managed Care – PPO

## 2013-03-04 ENCOUNTER — Encounter: Payer: Self-pay | Admitting: *Deleted

## 2013-03-04 ENCOUNTER — Telehealth: Payer: Self-pay | Admitting: Gastroenterology

## 2013-03-04 ENCOUNTER — Ambulatory Visit (INDEPENDENT_AMBULATORY_CARE_PROVIDER_SITE_OTHER): Payer: BC Managed Care – PPO | Admitting: Nurse Practitioner

## 2013-03-04 VITALS — BP 120/82 | HR 88 | Temp 98.0°F | Ht 68.0 in | Wt 241.0 lb

## 2013-03-04 DIAGNOSIS — R1011 Right upper quadrant pain: Secondary | ICD-10-CM

## 2013-03-04 LAB — CBC WITH DIFFERENTIAL/PLATELET
Basophils Absolute: 0 10*3/uL (ref 0.0–0.1)
Basophils Relative: 0.3 % (ref 0.0–3.0)
Eosinophils Absolute: 0.2 10*3/uL (ref 0.0–0.7)
Eosinophils Relative: 2.3 % (ref 0.0–5.0)
HCT: 38.1 % (ref 36.0–46.0)
Hemoglobin: 12.7 g/dL (ref 12.0–15.0)
Lymphocytes Relative: 22.4 % (ref 12.0–46.0)
Lymphs Abs: 1.6 10*3/uL (ref 0.7–4.0)
MCHC: 33.3 g/dL (ref 30.0–36.0)
MCV: 91.1 fl (ref 78.0–100.0)
Monocytes Absolute: 0.8 10*3/uL (ref 0.1–1.0)
Monocytes Relative: 11.3 % (ref 3.0–12.0)
Neutro Abs: 4.6 10*3/uL (ref 1.4–7.7)
Neutrophils Relative %: 63.7 % (ref 43.0–77.0)
Platelets: 320 10*3/uL (ref 150.0–400.0)
RBC: 4.18 Mil/uL (ref 3.87–5.11)
RDW: 13 % (ref 11.5–14.6)
WBC: 7.3 10*3/uL (ref 4.5–10.5)

## 2013-03-04 LAB — HEPATIC FUNCTION PANEL
ALT: 20 U/L (ref 0–35)
AST: 19 U/L (ref 0–37)
Albumin: 3.9 g/dL (ref 3.5–5.2)
Alkaline Phosphatase: 79 U/L (ref 39–117)
Bilirubin, Direct: 0.1 mg/dL (ref 0.0–0.3)
Total Bilirubin: 0.8 mg/dL (ref 0.3–1.2)
Total Protein: 7 g/dL (ref 6.0–8.3)

## 2013-03-04 MED ORDER — METHOCARBAMOL 750 MG PO TABS: 750.0000 mg | ORAL_TABLET | Freq: Every day | ORAL | Status: DC

## 2013-03-04 MED ORDER — IBUPROFEN 600 MG PO TABS: 600.0000 mg | ORAL_TABLET | Freq: Every day | ORAL | Status: DC

## 2013-03-04 NOTE — Patient Instructions (Addendum)
You have been scheduled for an abdominal ultrasound at [facility] (1st floor of hospital) on Wednesday, 03/06/13 at 8:30 am. Please arrive 15 minutes prior to your appointment for registration. Make certain not to have anything to eat or drink 6 hours prior to your appointment. Should you need to reschedule your appointment, please contact radiology at [phone]. This test typically takes about 30 minutes to perform.  Your physician has requested that you go to the basement for the following lab work before leaving today: CBC, Hepatic Function Panel  We have sent the following medications to your pharmacy for you to pick up at your convenience: Robaxin  Please purchase Motrin (ibuprofen) over the counter. Take 600 mg by mouth once daily x 10 days.  You have been scheduled for a follow up appointment with Dr Arlyce Dice for Friday, 04/05/13 @ 10:45 am.  CC: Dr Melvia Heaps, Dr Olivia Mackie

## 2013-03-04 NOTE — Telephone Encounter (Signed)
This morning while on her way to work, had episode of "having to run to bathroom." States she had severe diarrhea. Then she had sharp right upper abdomen pain. Tylenol has helped the pain. She is having a little nausea without vomiting. Denies cramping. States several family members have had gall bladder problems and she is worried about this. Scheduled with Willette Cluster, NP today at 2:00 PM.

## 2013-03-04 NOTE — Progress Notes (Signed)
  History of Present Illness:  Patient is a 42 year old female known to Dr. Arlyce Dice for history of GERD, gastroparesis and occasional constipation. Patient was last seen February of this year when she presented with recurrent GERD symptoms off her PPI. Her PPI was restarted. She was advised to start fiber supplements for constipation.  Patient is worked in today for pain below her right rib cage . Pain is "stabbing", radiates around to right mid back. Food exacerbates the pain. Tylenel takes the edge off. Two -three months ago she had a similar, but milder episode of this pain. No fevers. She is slightly nauseated. She occasionally takes takes motrin, no other NSAIDS. For the past week stools have been softer, more frequent than usual.This am she had an episode of diarrhea. No rectal bleeding.  No urinary discomfort, no hematuria.   Current Medications, Allergies, Past Medical History, Past Surgical History, Family History and Social History were reviewed in [employer] record.  Physical Exam: General: Well developed , white female in no acute distress Head: Normocephalic and atraumatic Eyes:  sclerae anicteric, conjunctiva pink  Ears: Normal auditory acuity Lungs: Clear throughout to auscultation Heart: Regular rate and rhythm Abdomen: Soft, non distended, mild RUQ tenderness. No masses, no hepatomegaly. Normal bowel sounds Musculoskeletal: Symmetrical with no gross deformities. Right lower ribs tender to palpation.   Extremities: No edema  Neurological: Alert oriented x 4, grossly nonfocal Psychological:  Alert and cooperative. Normal mood and affect  Assessment and Recommendations: Stabbing RUQ pain radiating around to right mid back. Pain may be musculoskeletal in origin. Gallbladder disease is a consideration as well. Will check some basic labs, schedule ultrasound of the abdomen. Will try Robaxin at bedtime as well as an anti-inflammatory while workup is in progress.  Will call patient with lab results tomorrow.

## 2013-03-06 ENCOUNTER — Ambulatory Visit (HOSPITAL_COMMUNITY)
Admission: RE | Admit: 2013-03-06 | Discharge: 2013-03-06 | Disposition: A | Discharge status: Home / Self Care | Payer: BC Managed Care – PPO | Source: Ambulatory Visit | Attending: Nurse Practitioner | Admitting: Nurse Practitioner

## 2013-03-06 DIAGNOSIS — R1011 Right upper quadrant pain: Secondary | ICD-10-CM | POA: Insufficient documentation

## 2013-03-10 NOTE — Progress Notes (Signed)
Reviewed and agree with management. Robert D. Kaplan, M.D., FACG  

## 2013-03-11 ENCOUNTER — Ambulatory Visit
Admission: RE | Admit: 2013-03-11 | Discharge: 2013-03-11 | Disposition: A | Discharge status: Home / Self Care | Payer: BC Managed Care – PPO | Source: Ambulatory Visit

## 2013-03-11 DIAGNOSIS — Z1231 Encounter for screening mammogram for malignant neoplasm of breast: Secondary | ICD-10-CM

## 2013-04-05 ENCOUNTER — Ambulatory Visit: Payer: BC Managed Care – PPO | Admitting: Gastroenterology

## 2013-04-17 ENCOUNTER — Ambulatory Visit: Payer: BC Managed Care – PPO | Admitting: Gastroenterology

## 2013-11-17 ENCOUNTER — Other Ambulatory Visit: Payer: Self-pay | Admitting: Gastroenterology

## 2013-11-25 ENCOUNTER — Telehealth: Payer: Self-pay | Admitting: Gastroenterology

## 2014-05-09 ENCOUNTER — Encounter: Payer: Self-pay | Admitting: Gastroenterology

## 2015-01-05 ENCOUNTER — Other Ambulatory Visit: Payer: Self-pay | Admitting: Gastroenterology

## 2015-01-12 ENCOUNTER — Telehealth: Payer: Self-pay | Admitting: Gastroenterology

## 2015-01-12 NOTE — Telephone Encounter (Signed)
Approved until 01/12/2016  Case ID# [id number]

## 2015-08-04 ENCOUNTER — Other Ambulatory Visit: Payer: Self-pay | Admitting: Gastroenterology

## 2015-08-27 ENCOUNTER — Other Ambulatory Visit (INDEPENDENT_AMBULATORY_CARE_PROVIDER_SITE_OTHER): Payer: BLUE CROSS/BLUE SHIELD

## 2015-08-27 ENCOUNTER — Ambulatory Visit (INDEPENDENT_AMBULATORY_CARE_PROVIDER_SITE_OTHER): Payer: BLUE CROSS/BLUE SHIELD | Admitting: Family

## 2015-08-27 ENCOUNTER — Encounter: Payer: Self-pay | Admitting: Family

## 2015-08-27 VITALS — BP 108/88 | HR 64 | Temp 97.6°F | Resp 18 | Ht 68.0 in | Wt 265.8 lb

## 2015-08-27 DIAGNOSIS — K219 Gastro-esophageal reflux disease without esophagitis: Secondary | ICD-10-CM | POA: Diagnosis not present

## 2015-08-27 DIAGNOSIS — R635 Abnormal weight gain: Secondary | ICD-10-CM

## 2015-08-27 LAB — LIPID PANEL
Cholesterol: 137 mg/dL (ref 0–200)
HDL: 47.4 mg/dL (ref >39.00)
LDL Cholesterol: 73 mg/dL (ref 0–99)
NonHDL: 89.48
Total CHOL/HDL Ratio: 3
Triglycerides: 80 mg/dL (ref 0.0–149.0)
VLDL: 16 mg/dL (ref 0.0–40.0)

## 2015-08-27 LAB — TSH: TSH: 1.22 u[IU]/mL (ref 0.35–4.50)

## 2015-08-27 LAB — HEMOGLOBIN A1C: Hgb A1c MFr Bld: 5.5 % (ref 4.6–6.5)

## 2015-08-27 LAB — CORTISOL: Cortisol, Plasma: 4.9 ug/dL

## 2015-08-27 MED ORDER — DEXLANSOPRAZOLE 60 MG PO CPDR: 1.0000 | DELAYED_RELEASE_CAPSULE | Freq: Every day | ORAL | Status: DC

## 2015-08-27 NOTE — Progress Notes (Signed)
Subjective:    Patient ID: Holly Chung, female    DOB: 04/06/1971, 44 y.o.   MRN: [id number]  Chief Complaint  Patient presents with  . Establish Care    has had a big weight gain and wants to have thyroid checked and diabetes, SOB    HPI:  Holly Chung is a 44 y.o. female who  has a past medical history of Unspecified constipation; Contact dermatitis and other eczema due to plants (except food); Esophageal reflux; Other malaise and fatigue; Gastroparesis; Irritable bowel syndrome; Unspecified asthma(493.90); Unspecified sinusitis (chronic); Predominant disturbance of emotions; Urinary tract infection, site not specified; Personal history of unspecified urinary disorder; Vitamin B12 deficiency; Allergic rhinitis, cause unspecified; Recurrent sinusitis; Obesity; GERD (gastroesophageal reflux disease); Anemia; Depression; Diverticulosis; Chicken pox; and Migraine. and presents today for an office visit to establish care.  1.) Weight gain - Associated symptom of weight gain with a severity of 14 pounds over about 2-2.5 months. Does have occasional shortness of breath with exercise and is unsure if this is related to weight or other cause. Modifying factors include attempting to walk however does not have the time or drive. She also worked on cutting back on food intake which has helped in the past. There have been several thinning spots of her hair and her skin is slightly dry. She does have some cold intolerance. There is fatigue and increased thirst and hunger.  2.) GERD - Currently maintained on Dexalant. Has not had the medication recently and notes the symptoms are well controlled with the medication. Denies adverse side effects when she takes it.    Filed Weights   08/27/15 0806  Weight: 265 lb 12.8 oz (120.566 kg)   Allergies  Allergen Reactions  . Naproxen Sodium     REACTION: Hives, syncope- note that patient can tolerate ibuprofen and prednisone     Outpatient Prescriptions  Prior to Visit  Medication Sig Dispense Refill  . AMBULATORY NON FORMULARY MEDICATION DGL ULTRA  2 CHEWABLE TABLETS AT NIGHT    . calcium carbonate (TUMS - DOSED IN MG ELEMENTAL CALCIUM) 500 MG chewable tablet Chew 1 tablet by mouth 3 (three) times daily.    Marland Kitchen. ibuprofen (ADVIL,MOTRIN) 600 MG tablet Take 1 tablet (600 mg total) by mouth daily. 1 tablet 0  . methocarbamol (ROBAXIN-750) 750 MG tablet Take 1 tablet (750 mg total) by mouth at bedtime. 10 tablet 0  . Multiple Vitamin (MULTIVITAMIN) tablet Take 1 tablet by mouth daily.    Marland Kitchen. DEXILANT 60 MG capsule TAKE 1 CAPSULE DAILY 90 capsule 1  . B Complex Vitamins (VITAMIN B COMPLEX PO) Take by mouth daily.       No facility-administered medications prior to visit.     Past Medical History  Diagnosis Date  . Unspecified constipation   . Contact dermatitis and other eczema due to plants (except food)   . Esophageal reflux   . Other malaise and fatigue   . Gastroparesis   . Irritable bowel syndrome   . Unspecified asthma(493.90)   . Unspecified sinusitis (chronic)   . Predominant disturbance of emotions   . Urinary tract infection, site not specified   . Personal history of unspecified urinary disorder   . Vitamin B12 deficiency   . Allergic rhinitis, cause unspecified     Reactive airways  . Recurrent sinusitis   . Obesity   . GERD (gastroesophageal reflux disease)   . Anemia   . Depression   . Diverticulosis   .  Chicken pox   . Migraine      Past Surgical History  Procedure Laterality Date  . Lumbar disc surgery  06/2003    L5-S1  . Incontinence surgery  3/10  . Tonsillectomy       Family History  Problem Relation Age of Onset  . Emphysema Father   . Heart disease Father   . Hypertension Father   . Colon cancer Maternal Aunt   . Rheum arthritis Mother   . Cancer Maternal Grandfather   . Heart disease Paternal Grandmother   . Emphysema Paternal Grandmother      Social History   Social History  . Marital  Status: Married    Spouse Name: N/A  . Number of Children: 3  . Years of Education: 14   Occupational History  . [employer]    Social History Main Topics  . Smoking status: Former Smoker -- 0.50 packs/day for 15 years    Types: Cigarettes    Quit date: 09/06/1999  . Smokeless tobacco: Never Used  . Alcohol Use: Yes     Comment: occasionally  . Drug Use: No  . Sexual Activity: Not on file   Other Topics Concern  . Not on file   Social History Narrative   Daily caffeine use:  Tea in the morning.   Fun: Play sports      Review of Systems  Constitutional: Positive for unexpected weight change. Negative for fever, chills and appetite change.  Respiratory: Positive for shortness of breath. Negative for chest tightness.   Cardiovascular: Negative for chest pain, palpitations and leg swelling.  Gastrointestinal: Negative for nausea, vomiting, diarrhea and constipation.  Neurological: Positive for headaches.      Objective:    BP 108/88 mmHg  Pulse 64  Temp(Src) 97.6 F (36.4 C) (Oral)  Resp 18  Ht  (1.727 m)  Wt 265 lb 12.8 oz (120.566 kg)  BMI 40.42 kg/m2  SpO2 97% Nursing note and vital signs reviewed.  Physical Exam  Constitutional: She is oriented to person, place, and time. She appears well-developed and well-nourished. No distress.  Neck: Neck supple. No thyromegaly present.  Cardiovascular: Normal rate, regular rhythm, normal heart sounds and intact distal pulses.   Pulmonary/Chest: Effort normal and breath sounds normal.  Lymphadenopathy:    She has no cervical adenopathy.  Neurological: She is alert and oriented to person, place, and time.  Skin: Skin is warm and dry.  Psychiatric: She has a normal mood and affect. Her behavior is normal. Judgment and thought content normal.       Assessment & Plan:   Problem List Items Addressed This Visit      Digestive   ESOPHAGEAL REFLUX    Reflux well maintained with Dexilant and no adverse side effects.  Continue current dosage of Dexilant. Follow up for symptoms worsening or no longer controlled with medication.       Relevant Medications   dexlansoprazole (DEXILANT) 60 MG capsule   Other Relevant Orders   Hemoglobin A1c   TSH   Cortisol     Other   Weight gain finding - Primary    Symptoms of weight gain of questionable origin although possibilities include increased caloric intake, thyroid disease or medication side effect. Obtain TSH, lipid profile, A1c, and cortisol levels. Recommend discussing discontinuation of fluoxentine as this has significant potential for weight gain. Discussed working on nutrition and physical activity to help maintain and progressively lose weight. Follow up pending lab work.  Relevant Medications   dexlansoprazole (DEXILANT) 60 MG capsule   Other Relevant Orders   Lipid Profile

## 2015-08-27 NOTE — Assessment & Plan Note (Signed)
Reflux well maintained with Dexilant and no adverse side effects. Continue current dosage of Dexilant. Follow up for symptoms worsening or no longer controlled with medication.

## 2015-08-27 NOTE — Assessment & Plan Note (Signed)
Symptoms of weight gain of questionable origin although possibilities include increased caloric intake, thyroid disease or medication side effect. Obtain TSH, lipid profile, A1c, and cortisol levels. Recommend discussing discontinuation of fluoxentine as this has significant potential for weight gain. Discussed working on nutrition and physical activity to help maintain and progressively lose weight. Follow up pending lab work.

## 2015-08-27 NOTE — Progress Notes (Signed)
Pre visit review using our clinic review tool, if applicable. No additional management support is needed unless otherwise documented below in the visit note. 

## 2015-08-27 NOTE — Patient Instructions (Signed)
Thank you for choosing [facility].  Summary/Instructions:  Your prescription(s) have been submitted to your pharmacy or been printed and provided for you. Please take as directed and contact our office if you believe you are having problem(s) with the medication(s) or have any questions.  If your symptoms worsen or fail to improve, please contact our office for further instruction, or in case of emergency go directly to the emergency room at the closest medical facility.    Please follow up with headache provider and regarding need for Prozac.

## 2015-08-28 ENCOUNTER — Telehealth: Payer: Self-pay | Admitting: Family

## 2015-08-28 NOTE — Telephone Encounter (Signed)
Please inform patient that her blood work is all within the normal limits and there is no evidence of thyroid disease or diabetes. Therefore we cannot rule out the fluoxetine as causing the weight gain in combination with nutrition and exercise. Please work on nutrition, exercise and speak with the headache provider regarding discontinuation of fluxetine and we can follow up in a month to determine your progress.

## 2015-09-01 NOTE — Telephone Encounter (Signed)
Pt aware of results 

## 2015-09-01 NOTE — Telephone Encounter (Signed)
LVM for pt to call back.

## 2015-10-23 ENCOUNTER — Other Ambulatory Visit: Payer: Self-pay

## 2015-10-23 ENCOUNTER — Telehealth: Payer: Self-pay | Admitting: Family

## 2015-10-23 MED ORDER — OSELTAMIVIR PHOSPHATE 75 MG PO CAPS: 75.0000 mg | ORAL_CAPSULE | Freq: Two times a day (BID) | ORAL | Status: DC

## 2015-10-23 MED ORDER — OSELTAMIVIR PHOSPHATE 75 MG PO CAPS: 75.0000 mg | ORAL_CAPSULE | Freq: Every day | ORAL | Status: DC

## 2015-10-23 NOTE — Telephone Encounter (Signed)
Pt's daughter has been diagnosed with the flu and now she is feverish and coughing and pts doctor advised her to call you to see if you can call in some Tamiflu Pharmacy is [facility] on [address] in [place]. Best number for her is [phone]

## 2015-10-23 NOTE — Telephone Encounter (Signed)
Notified patient.

## 2015-10-23 NOTE — Telephone Encounter (Signed)
Medication sent to pharmacy  

## 2015-10-23 NOTE — Telephone Encounter (Signed)
Forwarded to PCP:  Does pt need an appt?

## 2015-11-04 ENCOUNTER — Ambulatory Visit (INDEPENDENT_AMBULATORY_CARE_PROVIDER_SITE_OTHER): Payer: BLUE CROSS/BLUE SHIELD | Admitting: Family

## 2015-11-04 ENCOUNTER — Encounter: Payer: Self-pay | Admitting: Family

## 2015-11-04 VITALS — BP 122/74 | HR 72 | Temp 98.0°F | Resp 18 | Ht 68.0 in | Wt 271.0 lb

## 2015-11-04 DIAGNOSIS — J069 Acute upper respiratory infection, unspecified: Secondary | ICD-10-CM | POA: Diagnosis not present

## 2015-11-04 MED ORDER — ALBUTEROL SULFATE HFA 108 (90 BASE) MCG/ACT IN AERS: 2.0000 | INHALATION_SPRAY | Freq: Four times a day (QID) | RESPIRATORY_TRACT | Status: DC | PRN

## 2015-11-04 NOTE — Progress Notes (Signed)
Subjective:    Patient ID: Holly Chung, female    DOB: 1971-08-24, 45 y.o.   MRN: [id number]  Chief Complaint  Patient presents with  . Follow-up    had the flu a couple of weeks ago, [place] has some drainage, SOB, headache, and ear ache    HPI:  Holly Chung is a 45 y.o. female who  has a past medical history of Unspecified constipation; Contact dermatitis and other eczema due to plants (except food); Esophageal reflux; Other malaise and fatigue; Gastroparesis; Irritable bowel syndrome; Unspecified asthma(493.90); Unspecified sinusitis (chronic); Predominant disturbance of emotions; Urinary tract infection, site not specified; Personal history of unspecified urinary disorder; Vitamin B12 deficiency; Allergic rhinitis, cause unspecified; Recurrent sinusitis; Obesity; GERD (gastroesophageal reflux disease); Anemia; Depression; Diverticulosis; Chicken pox; and Migraine. and presents today for an acute office visit.   Associated symptoms of drainage, shortness of breath, headaches, and earaches has been going on for a couple of weeks. She was recently treated with a course of Tamiflu as a prophylaxis since her daughter was diagnosed with influenza. Last fever was about 1 week ago. Modifying factors include Mucinex DM which helps a little. Course of the symptoms are improving slowly and indicates that her symptoms are slightly better today. Denies recent antibiotic use.   Allergies  Allergen Reactions  . Naproxen Sodium     REACTION: Hives, syncope- note that patient can tolerate ibuprofen and prednisone     Current Outpatient Prescriptions on File Prior to Visit  Medication Sig Dispense Refill  . AMBULATORY NON FORMULARY MEDICATION DGL ULTRA  2 CHEWABLE TABLETS AT NIGHT    . atenolol (TENORMIN) 25 MG tablet Take 25 mg by mouth daily.    . calcium carbonate (TUMS - DOSED IN MG ELEMENTAL CALCIUM) 500 MG chewable tablet Chew 1 tablet by mouth 3 (three) times daily.    Marland Kitchen dexlansoprazole  (DEXILANT) 60 MG capsule Take 1 capsule (60 mg total) by mouth daily. 90 capsule 1  . ibuprofen (ADVIL,MOTRIN) 600 MG tablet Take 1 tablet (600 mg total) by mouth daily. 1 tablet 0  . methocarbamol (ROBAXIN-750) 750 MG tablet Take 1 tablet (750 mg total) by mouth at bedtime. 10 tablet 0  . Multiple Vitamin (MULTIVITAMIN) tablet Take 1 tablet by mouth daily.    Marland Kitchen zolmitriptan (ZOMIG) 5 MG tablet Take 5 mg by mouth as needed for migraine.     No current facility-administered medications on file prior to visit.    Review of Systems  Constitutional: Positive for fever. Negative for chills.  HENT: Positive for congestion, ear pain and sinus pressure. Negative for sore throat.   Respiratory: Positive for cough and shortness of breath. Negative for chest tightness and wheezing.   Neurological: Positive for headaches.      Objective:    BP 122/74 mmHg  Pulse 72  Temp(Src) 98 F (36.7 C) (Oral)  Resp 18  Ht  (1.727 m)  Wt 271 lb (122.925 kg)  BMI 41.22 kg/m2  SpO2 97% Nursing note and vital signs reviewed.  Physical Exam  Constitutional: She is oriented to person, place, and time. She appears well-developed and well-nourished. No distress.  HENT:  Right Ear: Hearing, tympanic membrane, external ear and ear canal normal.  Left Ear: Hearing, tympanic membrane, external ear and ear canal normal.  Nose: Right sinus exhibits no maxillary sinus tenderness and no frontal sinus tenderness. Left sinus exhibits no maxillary sinus tenderness and no frontal sinus tenderness.  Mouth/Throat: Uvula is midline, oropharynx  is clear and moist and mucous membranes are normal.  Cardiovascular: Normal rate, regular rhythm, normal heart sounds and intact distal pulses.   Pulmonary/Chest: Effort normal and breath sounds normal.  Neurological: She is alert and oriented to person, place, and time.  Skin: Skin is warm and dry.  Psychiatric: She has a normal mood and affect. Her behavior is normal. Judgment  and thought content normal.       Assessment & Plan:   Problem List Items Addressed This Visit      Respiratory   Acute upper respiratory infection - Primary    Symptoms and exam consistent with acute upper respiratory infection although cannot rule out resolving influenza. Continue conservative treatment with over-the-counter medications as needed for symptom relief and supportive care. Start albuterol as needed for shortness of breath and wheezing. Follow-up if symptoms worsen or fail to improve.      Relevant Medications   albuterol (PROVENTIL HFA;VENTOLIN HFA) 108 (90 Base) MCG/ACT inhaler

## 2015-11-04 NOTE — Assessment & Plan Note (Signed)
Symptoms and exam consistent with acute upper respiratory infection although cannot rule out resolving influenza. Continue conservative treatment with over-the-counter medications as needed for symptom relief and supportive care. Start albuterol as needed for shortness of breath and wheezing. Follow-up if symptoms worsen or fail to improve.

## 2015-11-04 NOTE — Patient Instructions (Signed)
Thank you for choosing [facility].  Summary/Instructions:  Your prescription(s) have been submitted to your pharmacy or been printed and provided for you. Please take as directed and contact our office if you believe you are having problem(s) with the medication(s) or have any questions.  If your symptoms worsen or fail to improve, please contact our office for further instruction, or in case of emergency go directly to the emergency room at the closest medical facility.   General Recommendations:    Please drink plenty of fluids.  Get plenty of rest   Sleep in humidified air  Use saline nasal sprays  Netti pot   OTC Medications:  Decongestants - helps relieve congestion   Flonase (generic fluticasone) or Nasacort (generic triamcinolone) - please make sure to use the "cross-over" technique at a 45 degree angle towards the opposite eye as opposed to straight up the nasal passageway.   Sudafed (generic pseudoephedrine - Note this is the one that is available behind the pharmacy counter); Products with phenylephrine (-PE) may also be used but is often not as effective as pseudoephedrine.   If you have HIGH BLOOD PRESSURE - Coricidin HBP; AVOID any product that is -D as this contains pseudoephedrine which may increase your blood pressure.  Afrin (oxymetazoline) every 6-8 hours for up to 3 days.   Allergies - helps relieve runny nose, itchy eyes and sneezing   Claritin (generic loratidine), Allegra (fexofenidine), or Zyrtec (generic cyrterizine) for runny nose. These medications should not cause drowsiness.  Note - Benadryl (generic diphenhydramine) may be used however may cause drowsiness  Cough -   Delsym or Robitussin (generic dextromethorphan)  Expectorants - helps loosen mucus to ease removal   Mucinex (generic guaifenesin) as directed on the package.  Headaches / General Aches   Tylenol (generic acetaminophen) - DO NOT EXCEED 3 grams (3,000 mg) in a 24  hour time period  Advil/Motrin (generic ibuprofen)   Sore Throat -   Salt water gargle   Chloraseptic (generic benzocaine) spray or lozenges / Sucrets (generic dyclonine)      Upper Respiratory Infection, Adult Most upper respiratory infections (URIs) are a viral infection of the air passages leading to the lungs. A URI affects the nose, throat, and upper air passages. The most common type of URI is nasopharyngitis and is typically referred to as "the common cold." URIs run their course and usually go away on their own. Most of the time, a URI does not require medical attention, but sometimes a bacterial infection in the upper airways can follow a viral infection. This is called a secondary infection. Sinus and middle ear infections are common types of secondary upper respiratory infections. Bacterial pneumonia can also complicate a URI. A URI can worsen asthma and chronic obstructive pulmonary disease (COPD). Sometimes, these complications can require emergency medical care and may be life threatening.  CAUSES Almost all URIs are caused by viruses. A virus is a type of germ and can spread from one person to another.  RISKS FACTORS You may be at risk for a URI if:  4. You smoke.  5. You have chronic heart or lung disease. 6. You have a weakened defense (immune) system.  7. You are very young or very old.  8. You have nasal allergies or asthma. 9. You work in crowded or poorly ventilated areas. 10. You work in health care facilities or schools. SIGNS AND SYMPTOMS  Symptoms typically develop 2-3 days after you come in contact with a cold virus.   Most viral URIs last 7-10 days. However, viral URIs from the influenza virus (flu virus) can last 14-18 days and are typically more severe. Symptoms may include:  2. Runny or stuffy (congested) nose.  3. Sneezing.  4. Cough.  5. Sore throat.  6. Headache.  7. Fatigue.  8. Fever.  9. Loss of appetite.  10. Pain in your forehead,  behind your eyes, and over your cheekbones (sinus pain). 11. Muscle aches.  DIAGNOSIS  Your health care provider may diagnose a URI by: 2. Physical exam. 3. Tests to check that your symptoms are not due to another condition such as: 1. Strep throat. 2. Sinusitis. 3. Pneumonia. 4. Asthma. TREATMENT  A URI goes away on its own with time. It cannot be cured with medicines, but medicines may be prescribed or recommended to relieve symptoms. Medicines may help: 3. Reduce your fever. 4. Reduce your cough. 5. Relieve nasal congestion. HOME CARE INSTRUCTIONS  3. Take medicines only as directed by your health care provider.  4. Gargle warm saltwater or take cough drops to comfort your throat as directed by your health care provider. 5. Use a warm mist humidifier or inhale steam from a shower to increase air moisture. This may make it easier to breathe. 6. Drink enough fluid to keep your urine clear or pale yellow.  7. Eat soups and other clear broths and maintain good nutrition.  8. Rest as needed.  9. Return to work when your temperature has returned to normal or as your health care provider advises. You may need to stay home longer to avoid infecting others. You can also use a face mask and careful hand washing to prevent spread of the virus. 10. Increase the usage of your inhaler if you have asthma.  11. Do not use any tobacco products, including cigarettes, chewing tobacco, or electronic cigarettes. If you need help quitting, ask your health care provider. PREVENTION  The best way to protect yourself from getting a cold is to practice good hygiene.  6. Avoid oral or hand contact with people with cold symptoms.  7. Wash your hands often if contact occurs.  There is no clear evidence that vitamin C, vitamin E, echinacea, or exercise reduces the chance of developing a cold. However, it is always recommended to get plenty of rest, exercise, and practice good nutrition.  SEEK MEDICAL CARE  IF:   You are getting worse rather than better.   Your symptoms are not controlled by medicine.   You have chills.  You have worsening shortness of breath.  You have brown or red mucus.  You have yellow or brown nasal discharge.  You have pain in your face, especially when you bend forward.  You have a fever.  You have swollen neck glands.  You have pain while swallowing.  You have white areas in the back of your throat. SEEK IMMEDIATE MEDICAL CARE IF:  2. You have severe or persistent: 1. Headache. 2. Ear pain. 3. Sinus pain. 4. Chest pain. 3. You have chronic lung disease and any of the following: 1. Wheezing. 2. Prolonged cough. 3. Coughing up blood. 4. A change in your usual mucus. 4. You have a stiff neck. 5. You have changes in your: 1. Vision. 2. Hearing. 3. Thinking. 4. Mood. MAKE SURE YOU:  3. Understand these instructions. 4. Will watch your condition. 5. Will get help right away if you are not doing well or get worse.   This information is not intended to replace   advice given to you by your health care provider. Make sure you discuss any questions you have with your health care provider.   Document Released: 02/15/2001 Document Revised: 01/06/2015 Document Reviewed: 11/27/2013 Elsevier Interactive Patient Education 2016 Elsevier Inc.   

## 2015-11-04 NOTE — Progress Notes (Signed)
Pre visit review using our clinic review tool, if applicable. No additional management support is needed unless otherwise documented below in the visit note. 

## 2015-12-02 ENCOUNTER — Telehealth: Payer: Self-pay | Admitting: *Deleted

## 2015-12-02 MED ORDER — BUPROPION HCL ER (SR) 150 MG PO TB12: ORAL_TABLET | ORAL | Status: DC

## 2015-12-02 NOTE — Telephone Encounter (Signed)
Receive call pt states Holly SoursGreg told her if she would like to start the Wellbrutrin to let him know. Pt is requesting low dose [facility] be [facility]...[name]/lmb

## 2015-12-02 NOTE — Telephone Encounter (Signed)
Medication sent.

## 2015-12-03 NOTE — Telephone Encounter (Signed)
Notified pt w/Greg response.../lmb 

## 2016-01-12 ENCOUNTER — Other Ambulatory Visit: Payer: Self-pay | Admitting: Family

## 2016-02-15 ENCOUNTER — Other Ambulatory Visit: Payer: Self-pay | Admitting: Family

## 2016-02-16 ENCOUNTER — Other Ambulatory Visit: Payer: Self-pay

## 2016-02-16 MED ORDER — BUPROPION HCL ER (SR) 150 MG PO TB12: ORAL_TABLET | ORAL | Status: DC

## 2016-04-05 ENCOUNTER — Other Ambulatory Visit: Payer: Self-pay | Admitting: Family

## 2016-05-13 DIAGNOSIS — M25572 Pain in left ankle and joints of left foot: Secondary | ICD-10-CM | POA: Diagnosis not present

## 2016-05-16 ENCOUNTER — Other Ambulatory Visit: Payer: Self-pay | Admitting: Family

## 2016-05-19 DIAGNOSIS — M25572 Pain in left ankle and joints of left foot: Secondary | ICD-10-CM | POA: Diagnosis not present

## 2016-05-19 DIAGNOSIS — S93492A Sprain of other ligament of left ankle, initial encounter: Secondary | ICD-10-CM | POA: Diagnosis not present

## 2016-06-02 DIAGNOSIS — S93492D Sprain of other ligament of left ankle, subsequent encounter: Secondary | ICD-10-CM | POA: Diagnosis not present

## 2016-06-24 DIAGNOSIS — S93492D Sprain of other ligament of left ankle, subsequent encounter: Secondary | ICD-10-CM | POA: Diagnosis not present

## 2016-06-24 DIAGNOSIS — M7712 Lateral epicondylitis, left elbow: Secondary | ICD-10-CM | POA: Diagnosis not present

## 2017-04-25 DIAGNOSIS — Z01419 Encounter for gynecological examination (general) (routine) without abnormal findings: Secondary | ICD-10-CM | POA: Diagnosis not present

## 2017-04-25 DIAGNOSIS — Z6839 Body mass index (BMI) 39.0-39.9, adult: Secondary | ICD-10-CM | POA: Diagnosis not present

## 2017-04-25 DIAGNOSIS — Z1231 Encounter for screening mammogram for malignant neoplasm of breast: Secondary | ICD-10-CM | POA: Diagnosis not present

## 2017-06-01 DIAGNOSIS — L218 Other seborrheic dermatitis: Secondary | ICD-10-CM | POA: Diagnosis not present

## 2017-06-22 ENCOUNTER — Ambulatory Visit (INDEPENDENT_AMBULATORY_CARE_PROVIDER_SITE_OTHER): Payer: BLUE CROSS/BLUE SHIELD | Admitting: Nurse Practitioner

## 2017-06-22 ENCOUNTER — Encounter: Payer: Self-pay | Admitting: Nurse Practitioner

## 2017-06-22 VITALS — BP 134/74 | HR 79 | Temp 98.5°F | Resp 16 | Ht 68.0 in | Wt 265.0 lb

## 2017-06-22 DIAGNOSIS — F329 Major depressive disorder, single episode, unspecified: Secondary | ICD-10-CM

## 2017-06-22 DIAGNOSIS — F419 Anxiety disorder, unspecified: Secondary | ICD-10-CM

## 2017-06-22 DIAGNOSIS — F32A Depression, unspecified: Secondary | ICD-10-CM

## 2017-06-22 MED ORDER — BUPROPION HCL ER (SR) 150 MG PO TB12: ORAL_TABLET | ORAL | 1 refills | Status: DC

## 2017-06-22 NOTE — Patient Instructions (Addendum)
I have sent a prescription for wellbutrin to your pharmacy. Youll take 1 tablet daily for 3 days, then 1 table twice daily thereafter.  If you have trouble getting appointment with [facility], let me know and I can place a referral for counseling.  I'd like to see you back in 1 month to see how youre doing.  It was nice to meet you. Thanks for letting me take care of you today :)

## 2017-06-22 NOTE — Progress Notes (Addendum)
Subjective:    Patient ID: Holly Chung, female    DOB: 06-Dec-1970, 46 y.o.   MRN: [id number]  HPI Holly Chung is a 46yo female Who presents today to establish care.She  Is transferring to me from another provider in the same clinic.  Anxiety and Depression- Shes been anxious almost every day of the week and cant stop worrying. She gets agitated frequently. She feels like she has not energy and can not focus on things. She does feel like she is able to walk away from stressors and calm down. She often feels down and uninterested in doing things. Denies thoughts of harming herself or others. Shes been on wellbutrin in the past when she was dealing with divorce and family deaths and it worked well for her.  She has free counseling through her job that shed like to try.  Review of Systems  See HPI  Past Medical History:  Diagnosis Date  . Allergic rhinitis, cause unspecified    Reactive airways  . Anemia   . Chicken pox   . Contact dermatitis and other eczema due to plants (except food)   . Depression   . Diverticulosis   . Esophageal reflux   . Gastroparesis   . GERD (gastroesophageal reflux disease)   . Irritable bowel syndrome   . Migraine   . Obesity   . Other malaise and fatigue   . Personal history of unspecified urinary disorder   . Predominant disturbance of emotions   . Recurrent sinusitis   . Unspecified asthma(493.90)   . Unspecified constipation   . Unspecified sinusitis (chronic)   . Urinary tract infection, site not specified   . Vitamin B12 deficiency      Social History   Social History  . Marital status: Married    Spouse name: N/A  . Number of children: 3  . Years of education: 14   Occupational History  . [employer]    Social History Main Topics  . Smoking status: Former Smoker    Packs/day: 0.50    Years: 15.00    Types: Cigarettes    Quit date: 09/06/1999  . Smokeless tobacco: Never Used  . Alcohol use Yes     Comment: occasionally  . Drug use:  No  . Sexual activity: Not on file   Other Topics Concern  . Not on file   Social History Narrative   Daily caffeine use:  Tea in the morning.   Fun: Play sports    Past Surgical History:  Procedure Laterality Date  . INCONTINENCE SURGERY  3/10  . LUMBAR DISC SURGERY  06/2003   L5-S1  . TONSILLECTOMY      Family History  Problem Relation Age of Onset  . Emphysema Father   . Heart disease Father   . Hypertension Father   . Rheum arthritis Mother   . Cancer Maternal Grandfather   . Heart disease Paternal Grandmother   . Emphysema Paternal Grandmother   . Colon cancer Maternal Aunt     Allergies  Allergen Reactions  . Naproxen Sodium     REACTION: Hives, syncope- note that patient can tolerate ibuprofen and prednisone    Current Outpatient Prescriptions on File Prior to Visit  Medication Sig Dispense Refill  . calcium carbonate (TUMS - DOSED IN MG ELEMENTAL CALCIUM) 500 MG chewable tablet Chew 1 tablet by mouth 3 (three) times daily.    Marland Kitchen. DEXILANT 60 MG capsule TAKE 1 CAPSULE DAILY 90 capsule 1  .  ibuprofen (ADVIL,MOTRIN) 600 MG tablet Take 1 tablet (600 mg total) by mouth daily. 1 tablet 0   No current facility-administered medications on file prior to visit.     BP 134/74 (BP Location: Left Arm, Patient Position: Sitting, Cuff Size: Large)   Pulse 79   Temp 98.5 F (36.9 C) (Oral)   Resp 16   Ht 5\' 8"  (1.727 m)   Wt 265 lb (120.2 kg)   SpO2 98%   BMI 40.29 kg/m       Objective:   Physical Exam  Constitutional: She is oriented to person, place, and time. She appears well-developed and well-nourished. No distress.  Cardiovascular: Normal rate, regular rhythm and normal heart sounds.   Pulmonary/Chest: Effort normal and breath sounds normal. No respiratory distress.  Neurological: She is alert and oriented to person, place, and time.  Skin: Skin is warm and dry.  Psychiatric: She has a normal mood and affect. Judgment and thought content normal.           Assessment & Plan:

## 2017-06-24 DIAGNOSIS — F32A Depression, unspecified: Secondary | ICD-10-CM | POA: Insufficient documentation

## 2017-06-24 DIAGNOSIS — F419 Anxiety disorder, unspecified: Principal | ICD-10-CM

## 2017-06-24 DIAGNOSIS — F329 Major depressive disorder, single episode, unspecified: Secondary | ICD-10-CM | POA: Insufficient documentation

## 2017-06-24 NOTE — Assessment & Plan Note (Signed)
Mild depression and anxiety. Shes contacted her employers [employer] counseling program to schedule counseling. She is requesting to restart wellbutrin which she's done well on in the past. Prescription sent for Wellbutrin SR150-1 tab for first 3 days then twice daily. Side effects and suicide precautions discussed with patient. Shell return in 1 month for a follow up.

## 2017-07-17 ENCOUNTER — Emergency Department (HOSPITAL_COMMUNITY): Payer: BLUE CROSS/BLUE SHIELD

## 2017-07-17 ENCOUNTER — Other Ambulatory Visit: Payer: Self-pay

## 2017-07-17 ENCOUNTER — Encounter (HOSPITAL_COMMUNITY): Payer: Self-pay | Admitting: *Deleted

## 2017-07-17 ENCOUNTER — Emergency Department (HOSPITAL_COMMUNITY)
Admission: EM | Admit: 2017-07-17 | Discharge: 2017-07-17 | Disposition: A | Discharge status: Home / Self Care | Payer: BLUE CROSS/BLUE SHIELD | Attending: Emergency Medicine | Admitting: Emergency Medicine

## 2017-07-17 DIAGNOSIS — J45909 Unspecified asthma, uncomplicated: Secondary | ICD-10-CM | POA: Diagnosis not present

## 2017-07-17 DIAGNOSIS — K2900 Acute gastritis without bleeding: Secondary | ICD-10-CM | POA: Insufficient documentation

## 2017-07-17 DIAGNOSIS — R0789 Other chest pain: Secondary | ICD-10-CM

## 2017-07-17 DIAGNOSIS — Z87891 Personal history of nicotine dependence: Secondary | ICD-10-CM | POA: Insufficient documentation

## 2017-07-17 DIAGNOSIS — Z79899 Other long term (current) drug therapy: Secondary | ICD-10-CM | POA: Insufficient documentation

## 2017-07-17 DIAGNOSIS — R079 Chest pain, unspecified: Secondary | ICD-10-CM | POA: Diagnosis not present

## 2017-07-17 DIAGNOSIS — R0602 Shortness of breath: Secondary | ICD-10-CM | POA: Diagnosis not present

## 2017-07-17 LAB — URINALYSIS, ROUTINE W REFLEX MICROSCOPIC
Bilirubin Urine: NEGATIVE
Glucose, UA: NEGATIVE mg/dL
Ketones, ur: 5 mg/dL — AB
Nitrite: NEGATIVE
Protein, ur: NEGATIVE mg/dL
Specific Gravity, Urine: 1.009 (ref 1.005–1.030)
pH: 7 (ref 5.0–8.0)

## 2017-07-17 LAB — HEPATIC FUNCTION PANEL
ALT: 14 U/L (ref 14–54)
AST: 18 U/L (ref 15–41)
Albumin: 4.2 g/dL (ref 3.5–5.0)
Alkaline Phosphatase: 87 U/L (ref 38–126)
Bilirubin, Direct: 0.1 mg/dL (ref 0.1–0.5)
Indirect Bilirubin: 0.5 mg/dL (ref 0.3–0.9)
Total Bilirubin: 0.6 mg/dL (ref 0.3–1.2)
Total Protein: 7.3 g/dL (ref 6.5–8.1)

## 2017-07-17 LAB — LIPASE, BLOOD: Lipase: 30 U/L (ref 11–51)

## 2017-07-17 LAB — BASIC METABOLIC PANEL
Anion gap: 8 (ref 5–15)
BUN: 9 mg/dL (ref 6–20)
CO2: 25 mmol/L (ref 22–32)
Calcium: 9 mg/dL (ref 8.9–10.3)
Chloride: 105 mmol/L (ref 101–111)
Creatinine, Ser: 0.64 mg/dL (ref 0.44–1.00)
GFR calc Af Amer: >60 mL/min (ref >60)
GFR calc non Af Amer: >60 mL/min (ref >60)
Glucose, Bld: 129 mg/dL — ABNORMAL HIGH (ref 65–99)
Potassium: 3.4 mmol/L — ABNORMAL LOW (ref 3.5–5.1)
Sodium: 138 mmol/L (ref 135–145)

## 2017-07-17 LAB — I-STAT TROPONIN, ED
Troponin i, poc: 0 ng/mL (ref 0.00–0.08)
Troponin i, poc: 0 ng/mL (ref 0.00–0.08)

## 2017-07-17 LAB — CBC
HCT: 38.8 % (ref 36.0–46.0)
Hemoglobin: 13.1 g/dL (ref 12.0–15.0)
MCH: 29.8 pg (ref 26.0–34.0)
MCHC: 33.8 g/dL (ref 30.0–36.0)
MCV: 88.4 fL (ref 78.0–100.0)
Platelets: 358 10*3/uL (ref 150–400)
RBC: 4.39 MIL/uL (ref 3.87–5.11)
RDW: 12.9 % (ref 11.5–15.5)
WBC: 8.1 10*3/uL (ref 4.0–10.5)

## 2017-07-17 LAB — D-DIMER, QUANTITATIVE: D-Dimer, Quant: <0.27 ug/mL-FEU (ref 0.00–0.50)

## 2017-07-17 MED ORDER — PANTOPRAZOLE SODIUM 40 MG PO TBEC
40.0000 mg | DELAYED_RELEASE_TABLET | Freq: Once | ORAL | Status: AC
  Administered 2017-07-17: 40 mg via ORAL
  Filled 2017-07-17: qty 1

## 2017-07-17 MED ORDER — PANTOPRAZOLE SODIUM 20 MG PO TBEC: 20.0000 mg | DELAYED_RELEASE_TABLET | Freq: Every day | ORAL | 0 refills | Status: AC

## 2017-07-17 MED ORDER — IOPAMIDOL (ISOVUE-370) INJECTION 76%
100.0000 mL | Freq: Once | INTRAVENOUS | Status: AC | PRN
  Administered 2017-07-17: 100 mL via INTRAVENOUS

## 2017-07-17 MED ORDER — MORPHINE SULFATE (PF) 4 MG/ML IV SOLN
4.0000 mg | Freq: Once | INTRAVENOUS | Status: AC
  Administered 2017-07-17: 4 mg via INTRAVENOUS
  Filled 2017-07-17: qty 1

## 2017-07-17 MED ORDER — IOPAMIDOL (ISOVUE-370) INJECTION 76%
INTRAVENOUS | Status: AC
  Filled 2017-07-17: qty 100

## 2017-07-17 MED ORDER — ONDANSETRON HCL 4 MG/2ML IJ SOLN
4.0000 mg | Freq: Once | INTRAMUSCULAR | Status: AC
  Administered 2017-07-17: 4 mg via INTRAVENOUS
  Filled 2017-07-17: qty 2

## 2017-07-17 MED ORDER — GI COCKTAIL ~~LOC~~
30.0000 mL | Freq: Once | ORAL | Status: AC
  Administered 2017-07-17: 30 mL via ORAL
  Filled 2017-07-17: qty 30

## 2017-07-17 NOTE — Discharge Instructions (Signed)
Return if any problems.  See your Physician for recheck  °

## 2017-07-17 NOTE — ED Provider Notes (Signed)
[facility]-EMERGENCY DEPT Provider Note   CSN: [id number] Arrival date & time: 07/17/17  0537     History   Chief Complaint Chief Complaint  Patient presents with  . Chest Pain    HPI Holly Chung is a 46 y.o. female.  The history is provided by the patient. No language interpreter was used.  Chest Pain   This is a new problem. The current episode started 3 to 5 hours ago. The problem occurs constantly. The problem has been gradually worsening. The pain is associated with movement. The pain is at a severity of 5/10. The pain is moderate. The pain does not radiate. She has tried nothing for the symptoms. The treatment provided moderate relief. There are no known risk factors.  Her family medical history is significant for heart disease.  Procedure history is negative for cardiac catheterization.  Pt complains of pain in her mid chest.  Pt reports pain goes through to her back.  Past Medical History:  Diagnosis Date  . Allergic rhinitis, cause unspecified    Reactive airways  . Anemia   . Chicken pox   . Contact dermatitis and other eczema due to plants (except food)   . Depression   . Diverticulosis   . Esophageal reflux   . Gastroparesis   . GERD (gastroesophageal reflux disease)   . Irritable bowel syndrome   . Migraine   . Obesity   . Other malaise and fatigue   . Personal history of unspecified urinary disorder   . Predominant disturbance of emotions   . Recurrent sinusitis   . Unspecified asthma(493.90)   . Unspecified constipation   . Unspecified sinusitis (chronic)   . Urinary tract infection, site not specified   . Vitamin B12 deficiency     Patient Active Problem List   Diagnosis Date Noted  . Anxiety and depression 06/24/2017  . Acute upper respiratory infection 11/04/2015  . Severe obesity (BMI >= 40) (HCC) 08/28/2015  . Weight gain finding 08/27/2015  . Vomiting 09/15/2011  . Fatigue 12/21/2010  . Headache(784.0) 12/21/2010    . Numbness and tingling in left hand 12/06/2010  . CONSTIPATION, INTERMITTENT 06/07/2010  . GASTROPARESIS 11/25/2009  . VITAMIN B12 DEFICIENCY 05/15/2009  . STRESS REACTION, ACUTE, WITH EMOTIONAL DISTURBANCE 05/08/2009  . ESOPHAGEAL REFLUX 05/08/2009  . FATIGUE 05/08/2009  . SINUSITIS, RECURRENT 02/13/2008  . VASOMOTOR RHINITIS 02/13/2008  . REACTIVE AIRWAY DISEASE 02/13/2008  . IBS 02/13/2008  . UTI'S, HX OF 02/13/2008  . CONTACT DERMATITIS&OTHER ECZEMA DUE TO PLANTS 01/31/2008    Past Surgical History:  Procedure Laterality Date  . INCONTINENCE SURGERY  3/10  . LUMBAR DISC SURGERY  06/2003   L5-S1  . TONSILLECTOMY      OB History    No data available       Home Medications    Prior to Admission medications   Medication Sig Start Date End Date Taking? Authorizing Provider  buPROPion (WELLBUTRIN SR) 150 MG 12 hr tablet AT THE START OF THERAPY TAKE 1 TABLET DAILY FOR 3 DAYS THEN TAKE 1 TABLET TWICE DAILY THEREAFTER Patient taking differently: Take 150 mg 2 (two) times daily by mouth.  06/22/17  Yes Shambley, Audie Box, NP  DEXILANT 60 MG capsule TAKE 1 CAPSULE DAILY 05/16/16  Yes Veryl Speak, FNP  ibuprofen (ADVIL,MOTRIN) 200 MG tablet Take 800 mg every 6 (six) hours as needed by mouth for fever, headache, mild pain, moderate pain or cramping.   Yes [provider]  lansoprazole (PREVACID) 15 MG capsule Take 15 mg daily as needed by mouth (for acid reflex).   Yes [provider]  Nutritional Supplements (JUICE PLUS FIBRE PO) Take 6 each daily by mouth.   Yes [provider]    Family History Family History  Problem Relation Age of Onset  . Emphysema Father   . Heart disease Father   . Hypertension Father   . Rheum arthritis Mother   . Cancer Maternal Grandfather   . Heart disease Paternal Grandmother   . Emphysema Paternal Grandmother   . Colon cancer Maternal Aunt     Social History Social History   Tobacco Use  . Smoking  status: Former Smoker    Packs/day: 0.50    Years: 15.00    Pack years: 7.50    Types: Cigarettes    Last attempt to quit: 09/06/1999    Years since quitting: 17.8  . Smokeless tobacco: Never Used  Substance Use Topics  . Alcohol use: Yes    Comment: occasionally  . Drug use: No     Allergies   Naproxen sodium   Review of Systems Review of Systems  Cardiovascular: Positive for chest pain.  All other systems reviewed and are negative.    Physical Exam Updated Vital Signs BP 130/80   Pulse 70   Temp 97.6 F (36.4 C) (Oral)   Resp 11   Ht 5\' 8"  (1.727 m)   Wt 113.4 kg (250 lb)   SpO2 100%   BMI 38.01 kg/m   Physical Exam  Constitutional: She is oriented to person, place, and time. She appears well-developed and well-nourished.  HENT:  Head: Normocephalic.  Eyes: EOM are normal.  Neck: Normal range of motion.  Cardiovascular: Normal rate, regular rhythm and normal pulses.  Pulmonary/Chest: Effort normal.  Abdominal: Soft. She exhibits no distension.  Musculoskeletal: Normal range of motion.  Neurological: She is alert and oriented to person, place, and time.  Skin: Skin is warm and dry.  Psychiatric: She has a normal mood and affect.  Nursing note and vitals reviewed.    ED Treatments / Results  Labs (all labs ordered are listed, but only abnormal results are displayed) Labs Reviewed  BASIC METABOLIC PANEL - Abnormal; Notable for the following components:      Result Value   Potassium 3.4 (*)    Glucose, Bld 129 (*)    All other components within normal limits  URINALYSIS, ROUTINE W REFLEX MICROSCOPIC - Abnormal; Notable for the following components:   Hgb urine dipstick SMALL (*)    Ketones, ur 5 (*)    Leukocytes, UA LARGE (*)    Bacteria, UA FEW (*)    Squamous Epithelial / LPF 0-5 (*)    All other components within normal limits  CBC  HEPATIC FUNCTION PANEL  D-DIMER, QUANTITATIVE (NOT AT [facility])  LIPASE, BLOOD  I-STAT TROPONIN, ED  I-STAT  TROPONIN, ED    EKG  EKG Interpretation  Date/Time:  Monday July 17 2017 05:46:04 EST Ventricular Rate:  72 PR Interval:    QRS Duration: 98 QT Interval:  425 QTC Calculation: 466 R Axis:   69 Text Interpretation:  Sinus rhythm Borderline T wave abnormalities Baseline wander in lead(s) I III aVL No significant change was found Reconfirmed by Alvira Monday ([id number]) on 07/17/2017 7:41:27 AM Also confirmed by Alvira Monday ([id number]), editor Madalyn Rob [phone])  on 07/17/2017 8:37:33 AM       Radiology Dg Chest 2 View  Result Date: 07/17/2017  CLINICAL DATA:  Acute onset of mid to left-sided chest pain and shortness of breath. EXAM: CHEST  2 VIEW COMPARISON:  None. FINDINGS: The lungs are well-aerated and clear. There is no evidence of focal opacification, pleural effusion or pneumothorax. The heart is normal in size; the mediastinal contour is within normal limits. No acute osseous abnormalities are seen. IMPRESSION: No acute cardiopulmonary process seen. Electronically Signed   By: Roanna RaiderJeffery  Chang M.D.   On: 07/17/2017 06:35   Ct Angio Chest/abd/pel For Dissection W And/or Wo Contrast  Result Date: 07/17/2017 CLINICAL DATA:  Chest pain, shortness of breath. EXAM: CT ANGIOGRAPHY CHEST, ABDOMEN AND PELVIS TECHNIQUE: Multidetector CT imaging through the chest, abdomen and pelvis was performed using the standard protocol during bolus administration of intravenous contrast. Multiplanar reconstructed images and MIPs were obtained and reviewed to evaluate the vascular anatomy. CONTRAST:  100mL ISOVUE-370 IOPAMIDOL (ISOVUE-370) INJECTION 76% COMPARISON:  Radiographs of same day. FINDINGS: CTA CHEST FINDINGS Cardiovascular: Preferential opacification of the thoracic aorta. No evidence of thoracic aortic aneurysm or dissection. Normal heart size. No pericardial effusion. Mediastinum/Nodes: No enlarged mediastinal, hilar, or axillary lymph nodes. Thyroid gland, trachea, and esophagus  demonstrate no significant findings. Lungs/Pleura: Lungs are clear. No pleural effusion or pneumothorax. Musculoskeletal: No chest wall abnormality. No acute or significant osseous findings. Review of the MIP images confirms the above findings. CTA ABDOMEN AND PELVIS FINDINGS VASCULAR Aorta: Normal caliber aorta without aneurysm, dissection, vasculitis or significant stenosis. Celiac: Patent without evidence of aneurysm, dissection, vasculitis or significant stenosis. SMA: Patent without evidence of aneurysm, dissection, vasculitis or significant stenosis. Renals: Both renal arteries are patent without evidence of aneurysm, dissection, vasculitis, fibromuscular dysplasia or significant stenosis. IMA: Patent without evidence of aneurysm, dissection, vasculitis or significant stenosis. Inflow: Patent without evidence of aneurysm, dissection, vasculitis or significant stenosis. Veins: No obvious venous abnormality within the limitations of this arterial phase study. Review of the MIP images confirms the above findings. NON-VASCULAR Hepatobiliary: No focal liver abnormality is seen. No gallstones, gallbladder wall thickening, or biliary dilatation. Pancreas: Unremarkable. No pancreatic ductal dilatation or surrounding inflammatory changes. Spleen: Normal in size without focal abnormality. Adrenals/Urinary Tract: Adrenal glands are unremarkable. Kidneys are normal, without renal calculi, focal lesion, or hydronephrosis. Bladder is unremarkable. Stomach/Bowel: Stomach is within normal limits. Appendix appears normal. No evidence of bowel wall thickening, distention, or inflammatory changes. Lymphatic: No significant vascular findings are present. No enlarged abdominal or pelvic lymph nodes. Reproductive: Uterus and bilateral adnexa are unremarkable. Other: No abdominal wall hernia or abnormality. No abdominopelvic ascites. Musculoskeletal: No acute or significant osseous findings. Review of the MIP images confirms the  above findings. IMPRESSION: No evidence of thoracic or abdominal aortic dissection or aneurysm. No significant abnormality seen in the chest, abdomen or pelvis. Electronically Signed   By: Lupita RaiderJames  Green Jr, M.D.   On: 07/17/2017 09:23    Procedures Procedures (including critical care time)  Medications Ordered in ED Medications  iopamidol (ISOVUE-370) 76 % injection (not administered)  morphine 4 MG/ML injection 4 mg (4 mg Intravenous Given 07/17/17 0659)  ondansetron (ZOFRAN) injection 4 mg (4 mg Intravenous Given 07/17/17 0659)  morphine 4 MG/ML injection 4 mg (4 mg Intravenous Given 07/17/17 0816)  iopamidol (ISOVUE-370) 76 % injection 100 mL (100 mLs Intravenous Contrast Given 07/17/17 0856)  gi cocktail (Maalox,Lidocaine,Donnatal) (30 mLs Oral Given 07/17/17 0957)  pantoprazole (PROTONIX) EC tablet 40 mg (40 mg Oral Given 07/17/17 0957)     Initial Impression / Assessment and Plan / ED Course  I have reviewed the triage vital signs and the nursing notes.  Pertinent labs & imaging results that were available during my care of the patient were reviewed by me and considered in my medical decision making (see chart for details).     Dr. Dalene SeltzerSchlossman in to see and examine pt   Ct scan no acute abnormality,  Troponin negative x 2.   EKg is normal.   Pt advised to follow up with primary care Md for recheck.  Pt given rx for Protonix.    Final Clinical Impressions(s) / ED Diagnoses   Final diagnoses:  Atypical chest pain  Acute gastritis without hemorrhage, unspecified gastritis type    ED Discharge Orders        Ordered    pantoprazole (PROTONIX) 20 MG tablet  Daily     07/17/17 1051    An After Visit Summary was printed and given to the patient.   Elson AreasSofia, Drayson Dorko K, PA-C 07/17/17 1051    Alvira MondaySchlossman, Erin, MD 07/23/17 1742

## 2017-07-17 NOTE — ED Triage Notes (Signed)
Pt stated "I've been having chest pain about 3 hours.  I've had some sweating and nausea.  I also had lower back pain yesterday."  Pt c/o radiation from mid-chest to left chest beneath breast.

## 2017-08-03 ENCOUNTER — Encounter: Payer: Self-pay | Admitting: Physician Assistant

## 2017-08-03 ENCOUNTER — Encounter (INDEPENDENT_AMBULATORY_CARE_PROVIDER_SITE_OTHER): Payer: Self-pay

## 2017-08-03 ENCOUNTER — Ambulatory Visit (INDEPENDENT_AMBULATORY_CARE_PROVIDER_SITE_OTHER): Payer: BLUE CROSS/BLUE SHIELD | Admitting: Physician Assistant

## 2017-08-03 VITALS — BP 132/82 | HR 72 | Ht 68.0 in | Wt 261.8 lb

## 2017-08-03 DIAGNOSIS — K219 Gastro-esophageal reflux disease without esophagitis: Secondary | ICD-10-CM | POA: Diagnosis not present

## 2017-08-03 DIAGNOSIS — R0789 Other chest pain: Secondary | ICD-10-CM | POA: Diagnosis not present

## 2017-08-03 DIAGNOSIS — R1013 Epigastric pain: Secondary | ICD-10-CM

## 2017-08-03 MED ORDER — PANTOPRAZOLE SODIUM 40 MG PO TBEC: DELAYED_RELEASE_TABLET | ORAL | 3 refills | Status: DC

## 2017-08-03 NOTE — Progress Notes (Addendum)
Subjective:    Patient ID: Holly Chung, female    DOB: 09-Jul-1971, 46 y.o.   MRN: [id number]  HPI Alvis LemmingsDawn is a pleasant 46 year old white female, known remotely to Dr. Arlyce DiceKaplan who comes to the office today after recent ER visit on 07/23/2017. Patient has history of GERD, previously documented gastroparesis on gastric emptying scan 2011, IBS, morbid obesity and anxiety/depression. She had undergone EGD per Dr. Arlyce DiceKaplan in 2010 which was a normal exam. Patient says she has done well over the past several years and has remained on Dexilant 60 mg by mouth every morning chronically. He says very infrequently she'll have some mild heartburn. She has no complaints of any ongoing indigestion no dysphagia or odynophagia. She says her ER visit was precipitated by an acute episode of chest pain which woke her up from sleep at 2 AM that day. She says she had pain in her lower and mid chest that radiated through to her upper back. She described it as being sharp and constant for at least 3 hours. She says she got up was pacing around, tried Tums, PPI, and took a baby aspirin because she was worried about her heart and when the pain would not is soft she went to the emergency room. She had associated nausea but no vomiting. She says the pain was severe and she feels she was nauseated due to the pain. She did not have any shortness of breath or diaphoresis. Pain finally eased off quite a while after she was given pain medication in the emergency room and has not recurred. She had cardiac workup done with negative EKG, negative troponins and CT and 2 of the chest was also negative. She says she does periodically get some pain in her upper back and is not sure why she is having that. She says she has never had associated with chest pain other than this one episode. Labs were reviewed, CBC, hepatic panel, d-dimer, and lipase as well as troponin all within normal limits. She was given Protonix in the emergency room and has  been taking that daily since.  Review of Systems Pertinent positive and negative review of systems were noted in the above HPI section.  All other review of systems was otherwise negative.  Outpatient Encounter Medications as of 08/03/2017  Medication Sig  . buPROPion (WELLBUTRIN SR) 150 MG 12 hr tablet AT THE START OF THERAPY TAKE 1 TABLET DAILY FOR 3 DAYS THEN TAKE 1 TABLET TWICE DAILY THEREAFTER (Patient taking differently: Take 150 mg 2 (two) times daily by mouth. )  . ibuprofen (ADVIL,MOTRIN) 200 MG tablet Take 800 mg every 6 (six) hours as needed by mouth for fever, headache, mild pain, moderate pain or cramping.  . Nutritional Supplements (JUICE PLUS FIBRE PO) Take 6 each daily by mouth.  . pantoprazole (PROTONIX) 20 MG tablet Take 1 tablet (20 mg total) daily by mouth.  . pantoprazole (PROTONIX) 40 MG tablet Take 1 tablet by mouth every morning.   No facility-administered encounter medications on file as of 08/03/2017.    Allergies  Allergen Reactions  . Naproxen Sodium     REACTION: Hives, syncope- note that patient can tolerate ibuprofen and prednisone   Patient Active Problem List   Diagnosis Date Noted  . Anxiety and depression 06/24/2017  . Acute upper respiratory infection 11/04/2015  . Severe obesity (BMI >= 40) (HCC) 08/28/2015  . Weight gain finding 08/27/2015  . Vomiting 09/15/2011  . Fatigue 12/21/2010  . Headache(784.0) 12/21/2010  .  Numbness and tingling in left hand 12/06/2010  . CONSTIPATION, INTERMITTENT 06/07/2010  . GASTROPARESIS 11/25/2009  . VITAMIN B12 DEFICIENCY 05/15/2009  . STRESS REACTION, ACUTE, WITH EMOTIONAL DISTURBANCE 05/08/2009  . ESOPHAGEAL REFLUX 05/08/2009  . FATIGUE 05/08/2009  . SINUSITIS, RECURRENT 02/13/2008  . VASOMOTOR RHINITIS 02/13/2008  . REACTIVE AIRWAY DISEASE 02/13/2008  . IBS 02/13/2008  . UTI'S, HX OF 02/13/2008  . CONTACT DERMATITIS&OTHER ECZEMA DUE TO PLANTS 01/31/2008   Social History   Socioeconomic History  .  Marital status: Married    Spouse name: Not on file  . Number of children: 3  . Years of education: 14                            Occupational History  . Occupation: [employer]  Tobacco Use  . Smoking status: Former Smoker    Packs/day: 0.50    Years: 15.00    Pack years: 7.50    Types: Cigarettes    Last attempt to quit: 09/06/1999    Years since quitting: 17.9  . Smokeless tobacco: Never Used  Substance and Sexual Activity  . Alcohol use: Yes    Comment: occasionally  . Drug use: No  .          Social History Narrative   Daily caffeine use:  Tea in the morning.   Fun: Play sports    Ms. Hausner's family history includes Brain cancer in her brother; Cancer in her maternal grandfather; Colon cancer in her maternal aunt; Emphysema in her father and paternal grandmother; Heart disease in her father and paternal grandmother; Hypertension in her father; Leukemia in her brother; Rheum arthritis in her mother.      Objective:    Vitals:   08/03/17 0842  BP: 132/82  Pulse: 72    Physical Exam ; well-developed white female in no acute distress, pleasant blood pressure 132/82 pulse 72, BMI 39.8. HEENT; nontraumatic normocephalic EOMI PERRLA sclera anicteric, Cardiovascular; regular rate and rhythm with S1-S2 no murmur or gallop, Pulmonary ;clear bilaterally, Abdomen, obese, soft nontender nondistended there is no palpable mass or hepatosplenomegaly bowel sounds are present, Rectal; exam not done, Ext; no clubbing cyanosis or edema skin warm and dry, Neuropsych; mood and affect appropriate       Assessment & Plan:   #501 46 year old white female with recent episode of acute mid substernal chest pain radiating to the upper back and lasting 3-4 hours. Workup in the emergency room on 07/23/2017 was negative for cardiac etiology and pulmonary embolism ruled out with CT angio. Labs were unremarkable Pain has not recurred Etiology of pain is not clear, patient describes the pain as  being very severe, doubt secondary to GERD. Will need to rule out biliary colic/biliary dyskinesia. #2 chronic GERD #3 previous history of gastroparesis Number for IBS #5 morbid obesity  Plan; Continue Protonix 40 mg by mouth every morning, refills sent Will schedule for upper abdominal ultrasound, if ultrasound is unrevealing proceed with [facility] HIDA scan. Patient will be established with Dr. Leone PayorGessner.  Sarahann Horrell S Elfida Shimada PA-C 08/03/2017   Cc: Evaristo BuryShambley, Ashleigh N, NP   Agree with Ms. Oswald HillockEsterwood's assessment and plan.  The US did show gallstones and she was referred to surgery to have evaluation and consider cholecystectomy.  Iva Booparl E. Gessner, MD, Clementeen GrahamFACG

## 2017-08-03 NOTE — Patient Instructions (Addendum)
We have sent the following medications to your pharmacy for you to pick up at your convenience: [facility] [address]  1. Pantoprazole Sodium 40 mg  [facility] of [facility] on Tuesday 08-08-2017 at 7:00 am. Please arrive at 6:45 am to your appointment for registration. Make certain not to have anything to eat or drink 6 hours prior to your appointment. Should you need to reschedule your appointment, please contact radiology at [phone]. This test typically takes about 30 minutes to perform.

## 2017-08-08 ENCOUNTER — Ambulatory Visit (HOSPITAL_COMMUNITY)
Admission: RE | Admit: 2017-08-08 | Discharge: 2017-08-08 | Disposition: A | Discharge status: Home / Self Care | Payer: BLUE CROSS/BLUE SHIELD | Source: Ambulatory Visit | Attending: Physician Assistant | Admitting: Physician Assistant

## 2017-08-08 DIAGNOSIS — K8020 Calculus of gallbladder without cholecystitis without obstruction: Secondary | ICD-10-CM | POA: Diagnosis not present

## 2017-08-08 DIAGNOSIS — K219 Gastro-esophageal reflux disease without esophagitis: Secondary | ICD-10-CM | POA: Insufficient documentation

## 2017-08-08 DIAGNOSIS — R1013 Epigastric pain: Secondary | ICD-10-CM

## 2017-08-09 ENCOUNTER — Ambulatory Visit: Payer: BLUE CROSS/BLUE SHIELD | Admitting: Nurse Practitioner

## 2017-08-17 ENCOUNTER — Telehealth: Payer: Self-pay | Admitting: Physician Assistant

## 2017-08-17 NOTE — Telephone Encounter (Signed)
I have a fax confirmation from 08/10/17. I have faxed again from a different machine today. Maybe our fax machine is not functioning.

## 2017-08-21 ENCOUNTER — Telehealth: Payer: Self-pay | Admitting: *Deleted

## 2017-08-21 NOTE — Telephone Encounter (Signed)
Did receive fax from [facility]. The patient has an appointment with Dr. Sheliah HatchKinsinger for 08-28-2017 at 3:15 PM.  The patient should arrive at 2:45 PM.  The patient was contacted and did speak to the representative from [facility].

## 2017-08-22 ENCOUNTER — Other Ambulatory Visit: Payer: Self-pay | Admitting: Internal Medicine

## 2017-08-30 DIAGNOSIS — K8010 Calculus of gallbladder with chronic cholecystitis without obstruction: Secondary | ICD-10-CM | POA: Diagnosis not present

## 2017-09-01 ENCOUNTER — Other Ambulatory Visit: Payer: Self-pay | Admitting: General Surgery

## 2017-09-01 DIAGNOSIS — K8010 Calculus of gallbladder with chronic cholecystitis without obstruction: Secondary | ICD-10-CM | POA: Diagnosis not present

## 2017-09-01 DIAGNOSIS — K812 Acute cholecystitis with chronic cholecystitis: Secondary | ICD-10-CM | POA: Diagnosis not present

## 2017-10-06 ENCOUNTER — Ambulatory Visit: Payer: BLUE CROSS/BLUE SHIELD | Admitting: Nurse Practitioner

## 2017-11-10 ENCOUNTER — Other Ambulatory Visit: Payer: Self-pay

## 2017-11-10 MED ORDER — BUPROPION HCL ER (SR) 150 MG PO TB12: ORAL_TABLET | ORAL | 0 refills | Status: DC

## 2018-01-30 ENCOUNTER — Other Ambulatory Visit: Payer: Self-pay

## 2018-01-30 MED ORDER — BUPROPION HCL ER (SR) 150 MG PO TB12
ORAL_TABLET | ORAL | 0 refills | Status: AC
Start: 2018-01-30 — End: ?

## 2018-03-06 ENCOUNTER — Other Ambulatory Visit: Payer: Self-pay | Admitting: *Deleted

## 2018-03-06 MED ORDER — PANTOPRAZOLE SODIUM 40 MG PO TBEC: DELAYED_RELEASE_TABLET | ORAL | 0 refills | Status: DC

## 2018-04-17 ENCOUNTER — Other Ambulatory Visit: Payer: Self-pay | Admitting: Physician Assistant

## 2018-05-28 DIAGNOSIS — Z23 Encounter for immunization: Secondary | ICD-10-CM | POA: Diagnosis not present

## 2018-06-16 ENCOUNTER — Other Ambulatory Visit: Payer: Self-pay | Admitting: Physician Assistant

## 2018-06-25 DIAGNOSIS — Z1231 Encounter for screening mammogram for malignant neoplasm of breast: Secondary | ICD-10-CM | POA: Diagnosis not present

## 2018-06-25 DIAGNOSIS — Z01419 Encounter for gynecological examination (general) (routine) without abnormal findings: Secondary | ICD-10-CM | POA: Diagnosis not present

## 2018-06-25 DIAGNOSIS — R05 Cough: Secondary | ICD-10-CM | POA: Diagnosis not present

## 2018-06-25 DIAGNOSIS — Z1151 Encounter for screening for human papillomavirus (HPV): Secondary | ICD-10-CM | POA: Diagnosis not present

## 2018-06-25 DIAGNOSIS — N925 Other specified irregular menstruation: Secondary | ICD-10-CM | POA: Diagnosis not present

## 2018-06-25 DIAGNOSIS — J069 Acute upper respiratory infection, unspecified: Secondary | ICD-10-CM | POA: Diagnosis not present

## 2018-06-25 DIAGNOSIS — Z6838 Body mass index (BMI) 38.0-38.9, adult: Secondary | ICD-10-CM | POA: Diagnosis not present

## 2018-07-16 ENCOUNTER — Other Ambulatory Visit: Payer: Self-pay | Admitting: Physician Assistant

## 2018-08-15 ENCOUNTER — Other Ambulatory Visit: Payer: Self-pay | Admitting: Physician Assistant

## 2019-02-20 IMAGING — CT CT ANGIO CHEST-ABD-PELV FOR DISSECTION W/ AND WO/W CM
2 of 7 series · 15 of 46 positions shown, 17 images · IV contrast (ISOVUE 370)
Comparison: Radiographs of same day.

CLINICAL DATA: Chest pain, shortness of breath.

EXAM:
CT ANGIOGRAPHY CHEST, ABDOMEN AND PELVIS
TECHNIQUE: Multidetector CT imaging through the chest, abdomen and pelvis was
performed using the standard protocol during bolus administration of
intravenous contrast. Multiplanar reconstructed images and MIPs were
obtained and reviewed to evaluate the vascular anatomy.
CONTRAST:  100mL EAQTAU-R0G IOPAMIDOL (EAQTAU-R0G) INJECTION 76%

[Series 5: arterial 3.0 b30f · axial · arterial · 0.79mm/px · z∈[+1128,+1710]mm · 12 of 218 slices shown, 14 images]
[im 12/218  soft-tissue]
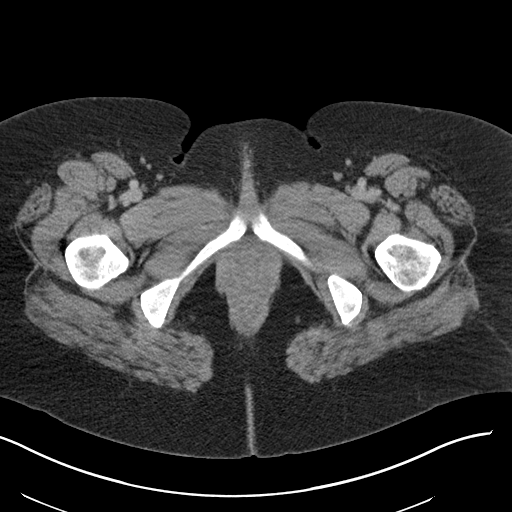
[im 12/218  bone]
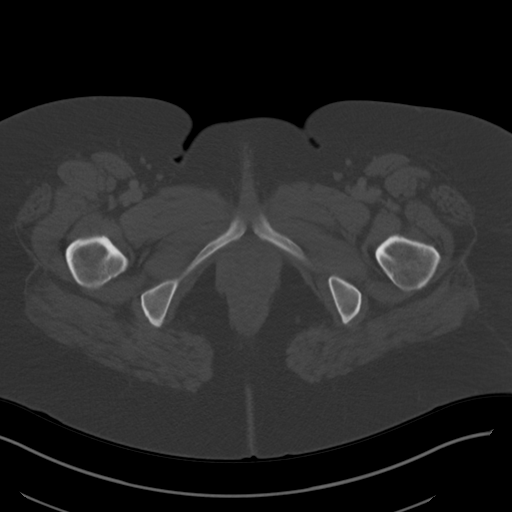
[im 35/218  soft-tissue]
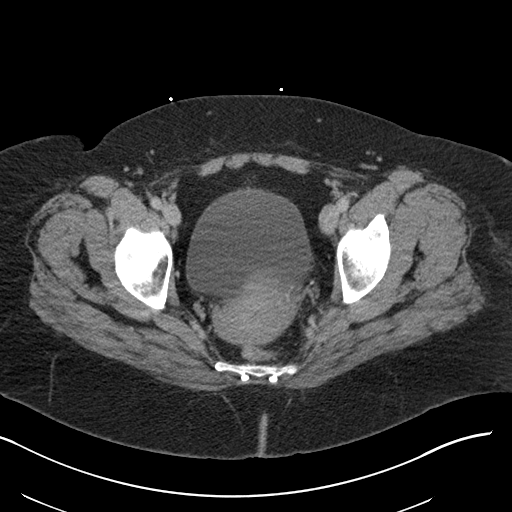
[im 46/218  soft-tissue]
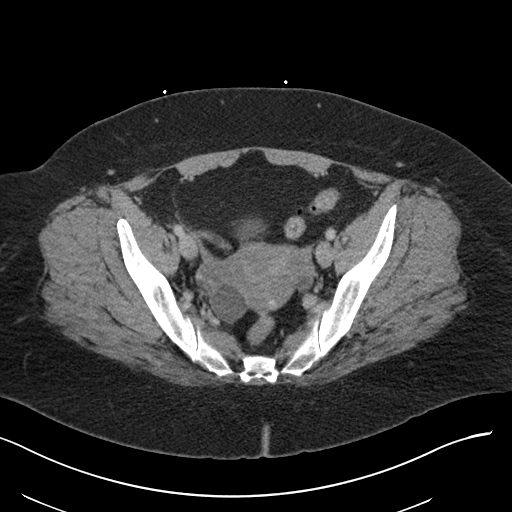
[im 69/218  soft-tissue]
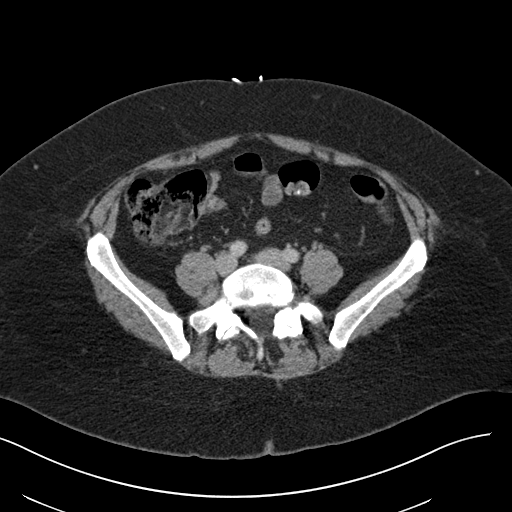
[im 80/218  soft-tissue]
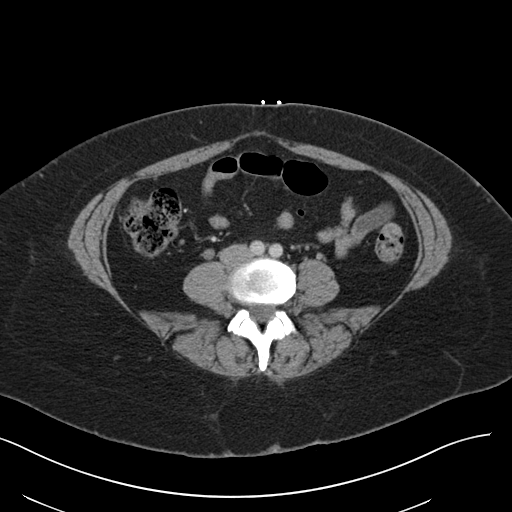
[im 103/218  soft-tissue]
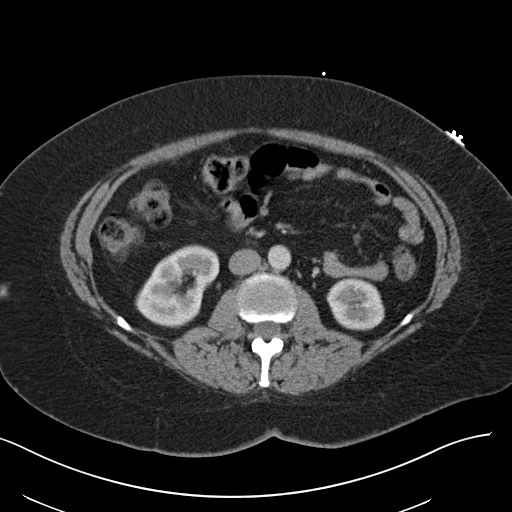
[im 115/218  soft-tissue]
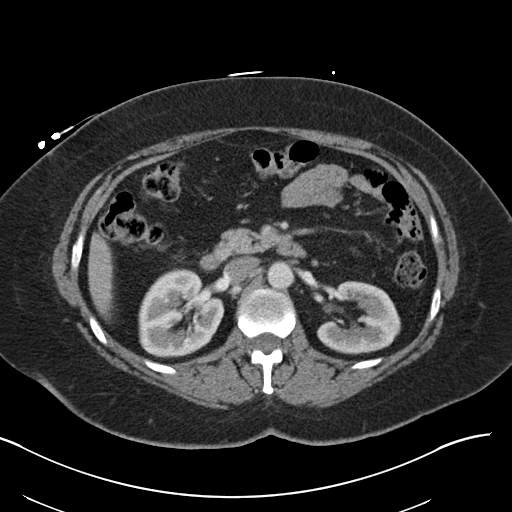
[im 138/218  soft-tissue]
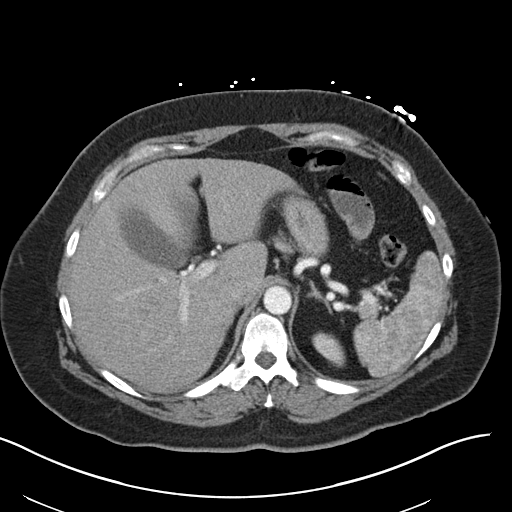
[im 149/218  soft-tissue]
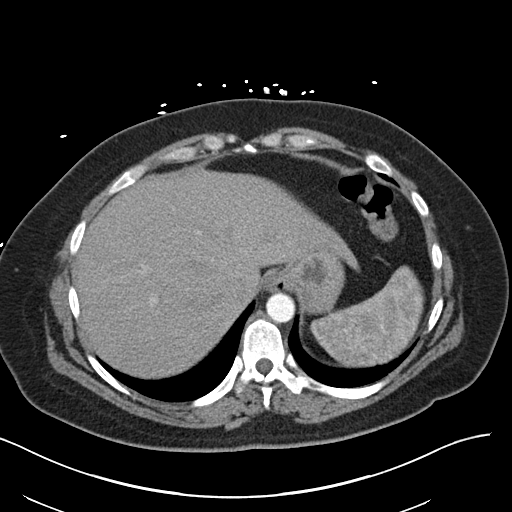
[im 149/218  bone]
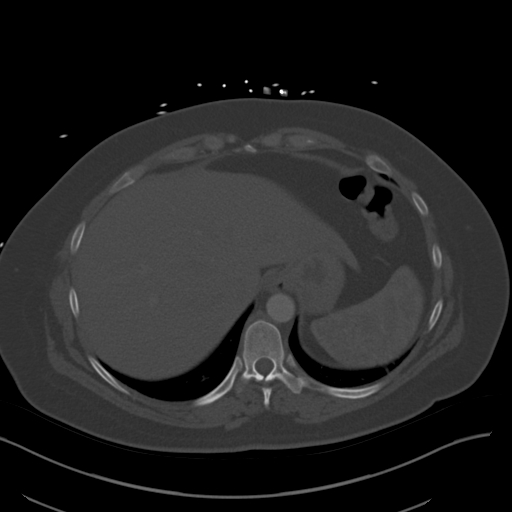
[im 172/218  soft-tissue]
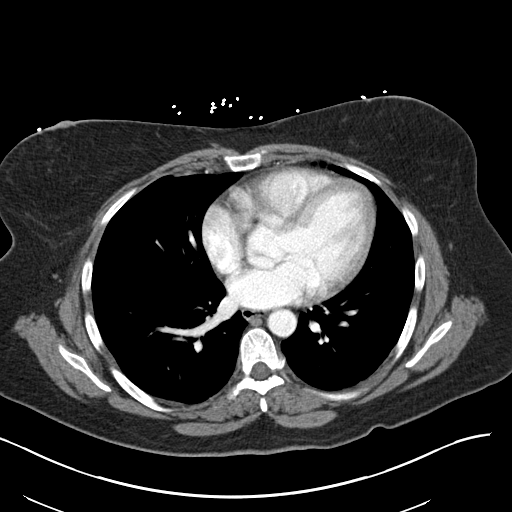
[im 183/218  soft-tissue]
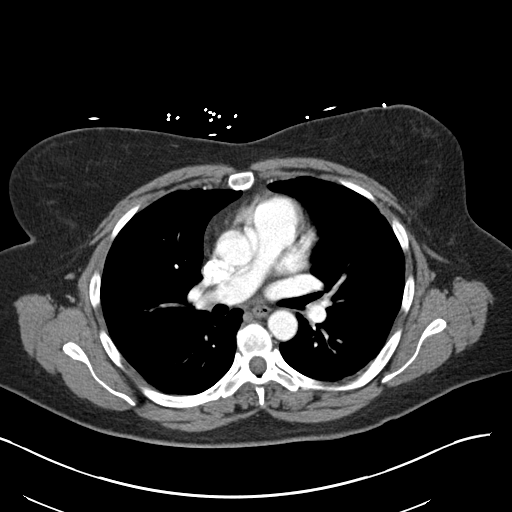
[im 206/218  soft-tissue]
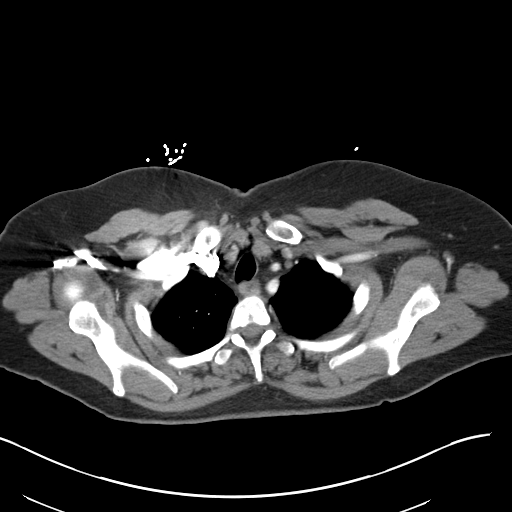

[Series 6: coronal mpr · coronal · 0.96mm/px · 3 of 154 slices shown]
[im 39/154  soft-tissue]
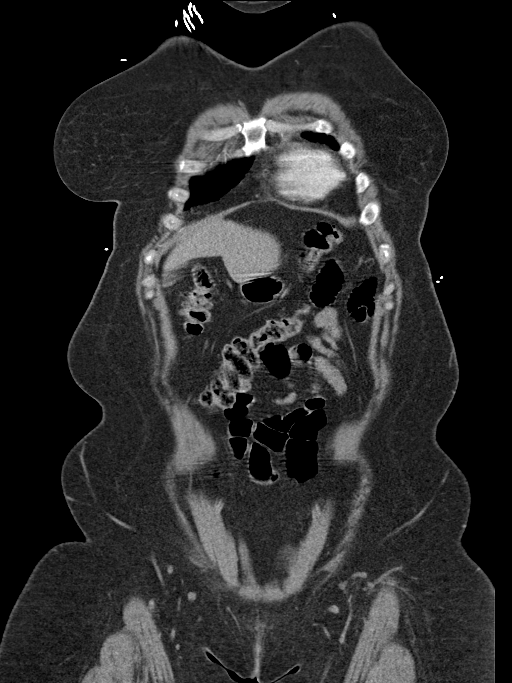
[im 77/154  soft-tissue]
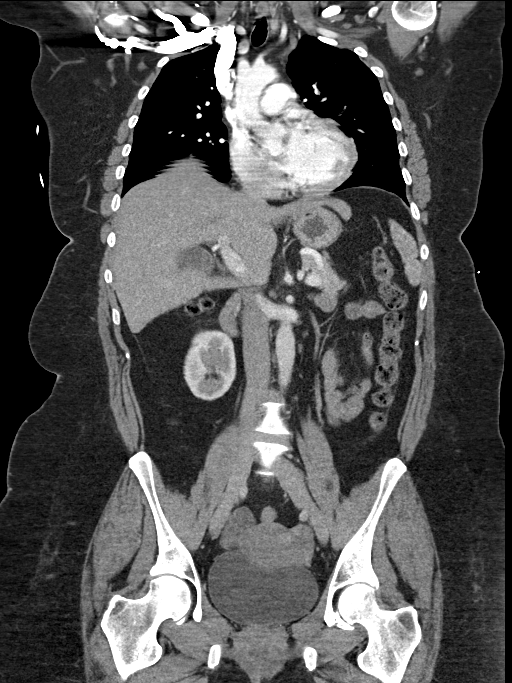
[im 115/154  soft-tissue]
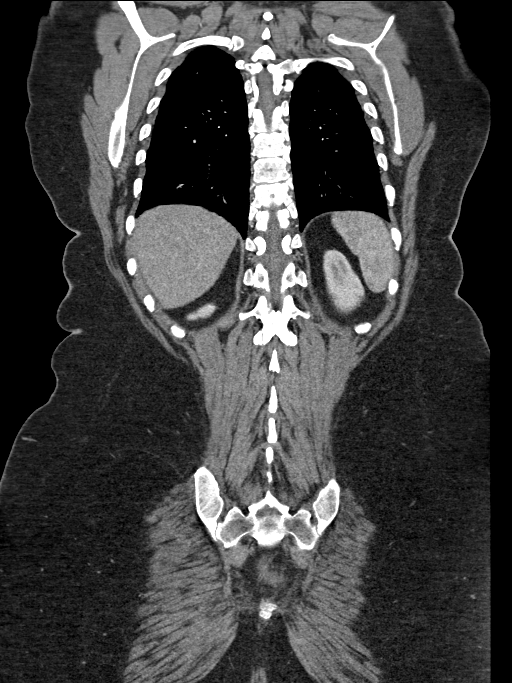

[15 of 46 positions shown; findings below may reference images not displayed]

FINDINGS: CTA CHEST FINDINGS

Cardiovascular: Preferential opacification of the thoracic aorta. No
evidence of thoracic aortic aneurysm or dissection. Normal heart
size. No pericardial effusion.

Mediastinum/Nodes: No enlarged mediastinal, hilar, or axillary lymph
nodes. Thyroid gland, trachea, and esophagus demonstrate no
significant findings.

Lungs/Pleura: Lungs are clear. No pleural effusion or pneumothorax.

Musculoskeletal: No chest wall abnormality. No acute or significant
osseous findings.

Review of the MIP images confirms the above findings.

CTA ABDOMEN AND PELVIS FINDINGS

VASCULAR

Aorta: Normal caliber aorta without aneurysm, dissection, vasculitis
or significant stenosis.

Celiac: Patent without evidence of aneurysm, dissection, vasculitis
or significant stenosis.

SMA: Patent without evidence of aneurysm, dissection, vasculitis or
significant stenosis.

Renals: Both renal arteries are patent without evidence of aneurysm,
dissection, vasculitis, fibromuscular dysplasia or significant
stenosis.

IMA: Patent without evidence of aneurysm, dissection, vasculitis or
significant stenosis.

Inflow: Patent without evidence of aneurysm, dissection, vasculitis
or significant stenosis.

Veins: No obvious venous abnormality within the limitations of this
arterial phase study.

Review of the MIP images confirms the above findings.

NON-VASCULAR

Hepatobiliary: No focal liver abnormality is seen. No gallstones,
gallbladder wall thickening, or biliary dilatation.

Pancreas: Unremarkable. No pancreatic ductal dilatation or
surrounding inflammatory changes.

Spleen: Normal in size without focal abnormality.

Adrenals/Urinary Tract: Adrenal glands are unremarkable. Kidneys are
normal, without renal calculi, focal lesion, or hydronephrosis.
Bladder is unremarkable.

Stomach/Bowel: Stomach is within normal limits. Appendix appears
normal. No evidence of bowel wall thickening, distention, or
inflammatory changes.

Lymphatic: No significant vascular findings are present. No enlarged
abdominal or pelvic lymph nodes.

Reproductive: Uterus and bilateral adnexa are unremarkable.

Other: No abdominal wall hernia or abnormality. No abdominopelvic
ascites.

Musculoskeletal: No acute or significant osseous findings.

Review of the MIP images confirms the above findings.
IMPRESSION: No evidence of thoracic or abdominal aortic dissection or aneurysm.

No significant abnormality seen in the chest, abdomen or pelvis.

## 2019-05-21 DIAGNOSIS — J0191 Acute recurrent sinusitis, unspecified: Secondary | ICD-10-CM | POA: Diagnosis not present

## 2019-05-21 DIAGNOSIS — R509 Fever, unspecified: Secondary | ICD-10-CM | POA: Diagnosis not present

## 2019-05-23 DIAGNOSIS — R399 Unspecified symptoms and signs involving the genitourinary system: Secondary | ICD-10-CM | POA: Diagnosis not present

## 2019-05-29 DIAGNOSIS — R21 Rash and other nonspecific skin eruption: Secondary | ICD-10-CM | POA: Diagnosis not present

## 2019-11-05 DIAGNOSIS — Z03818 Encounter for observation for suspected exposure to other biological agents ruled out: Secondary | ICD-10-CM | POA: Diagnosis not present

## 2019-11-05 DIAGNOSIS — R1032 Left lower quadrant pain: Secondary | ICD-10-CM | POA: Diagnosis not present

## 2019-11-05 DIAGNOSIS — R399 Unspecified symptoms and signs involving the genitourinary system: Secondary | ICD-10-CM | POA: Diagnosis not present

## 2019-11-05 DIAGNOSIS — J029 Acute pharyngitis, unspecified: Secondary | ICD-10-CM | POA: Diagnosis not present

## 2020-03-18 DIAGNOSIS — K648 Other hemorrhoids: Secondary | ICD-10-CM | POA: Diagnosis not present

## 2020-03-18 DIAGNOSIS — Z01818 Encounter for other preprocedural examination: Secondary | ICD-10-CM | POA: Diagnosis not present

## 2020-04-08 DIAGNOSIS — Z20822 Contact with and (suspected) exposure to covid-19: Secondary | ICD-10-CM | POA: Diagnosis not present

## 2020-04-08 DIAGNOSIS — Z03818 Encounter for observation for suspected exposure to other biological agents ruled out: Secondary | ICD-10-CM | POA: Diagnosis not present

## 2020-06-18 DIAGNOSIS — F419 Anxiety disorder, unspecified: Secondary | ICD-10-CM | POA: Diagnosis not present

## 2020-06-22 DIAGNOSIS — Z1159 Encounter for screening for other viral diseases: Secondary | ICD-10-CM | POA: Diagnosis not present

## 2020-06-25 DIAGNOSIS — K6289 Other specified diseases of anus and rectum: Secondary | ICD-10-CM | POA: Diagnosis not present

## 2020-06-25 DIAGNOSIS — K635 Polyp of colon: Secondary | ICD-10-CM | POA: Diagnosis not present

## 2020-06-25 DIAGNOSIS — K5730 Diverticulosis of large intestine without perforation or abscess without bleeding: Secondary | ICD-10-CM | POA: Diagnosis not present

## 2020-06-25 DIAGNOSIS — D120 Benign neoplasm of cecum: Secondary | ICD-10-CM | POA: Diagnosis not present

## 2020-06-25 DIAGNOSIS — Z1211 Encounter for screening for malignant neoplasm of colon: Secondary | ICD-10-CM | POA: Diagnosis not present

## 2020-08-03 DIAGNOSIS — Z13220 Encounter for screening for lipoid disorders: Secondary | ICD-10-CM | POA: Diagnosis not present

## 2020-08-03 DIAGNOSIS — E538 Deficiency of other specified B group vitamins: Secondary | ICD-10-CM | POA: Diagnosis not present

## 2020-08-03 DIAGNOSIS — F419 Anxiety disorder, unspecified: Secondary | ICD-10-CM | POA: Diagnosis not present

## 2020-08-03 DIAGNOSIS — Z23 Encounter for immunization: Secondary | ICD-10-CM | POA: Diagnosis not present

## 2020-08-03 DIAGNOSIS — Z0000 Encounter for general adult medical examination without abnormal findings: Secondary | ICD-10-CM | POA: Diagnosis not present

## 2020-09-17 DIAGNOSIS — Z20822 Contact with and (suspected) exposure to covid-19: Secondary | ICD-10-CM | POA: Diagnosis not present

## 2020-09-17 DIAGNOSIS — Z03818 Encounter for observation for suspected exposure to other biological agents ruled out: Secondary | ICD-10-CM | POA: Diagnosis not present

## 2021-01-27 DIAGNOSIS — N951 Menopausal and female climacteric states: Secondary | ICD-10-CM | POA: Diagnosis not present

## 2021-01-27 DIAGNOSIS — Z01419 Encounter for gynecological examination (general) (routine) without abnormal findings: Secondary | ICD-10-CM | POA: Diagnosis not present

## 2021-01-27 DIAGNOSIS — Z6841 Body Mass Index (BMI) 40.0 and over, adult: Secondary | ICD-10-CM | POA: Diagnosis not present

## 2021-01-27 DIAGNOSIS — Z124 Encounter for screening for malignant neoplasm of cervix: Secondary | ICD-10-CM | POA: Diagnosis not present

## 2021-02-11 DIAGNOSIS — F419 Anxiety disorder, unspecified: Secondary | ICD-10-CM | POA: Diagnosis not present

## 2021-07-15 DIAGNOSIS — J029 Acute pharyngitis, unspecified: Secondary | ICD-10-CM | POA: Diagnosis not present

## 2021-07-15 DIAGNOSIS — B349 Viral infection, unspecified: Secondary | ICD-10-CM | POA: Diagnosis not present

## 2021-08-04 DIAGNOSIS — Z13220 Encounter for screening for lipoid disorders: Secondary | ICD-10-CM | POA: Diagnosis not present

## 2021-08-04 DIAGNOSIS — Z0000 Encounter for general adult medical examination without abnormal findings: Secondary | ICD-10-CM | POA: Diagnosis not present

## 2021-08-04 DIAGNOSIS — F419 Anxiety disorder, unspecified: Secondary | ICD-10-CM | POA: Diagnosis not present

## 2021-08-05 ENCOUNTER — Ambulatory Visit (INDEPENDENT_AMBULATORY_CARE_PROVIDER_SITE_OTHER): Payer: BC Managed Care – PPO | Admitting: Podiatry

## 2021-08-05 ENCOUNTER — Other Ambulatory Visit: Payer: Self-pay

## 2021-08-05 ENCOUNTER — Encounter: Payer: Self-pay | Admitting: Podiatry

## 2021-08-05 ENCOUNTER — Ambulatory Visit (INDEPENDENT_AMBULATORY_CARE_PROVIDER_SITE_OTHER): Payer: BC Managed Care – PPO

## 2021-08-05 DIAGNOSIS — M6740 Ganglion, unspecified site: Secondary | ICD-10-CM

## 2021-08-05 DIAGNOSIS — S99921A Unspecified injury of right foot, initial encounter: Secondary | ICD-10-CM

## 2021-08-06 NOTE — Progress Notes (Signed)
Subjective:   Patient ID: Holly Chung, female   DOB: 50 y.o.   MRN: [id number]   HPI Patient presents stating that she injured her right foot several months ago dropping a generator on it and there is been a nodule on top of the foot that is been sore since.  Patient does not smoke likes to be active   Review of Systems  All other systems reviewed and are negative.      Objective:  Physical Exam Vitals and nursing note reviewed.  Constitutional:      Appearance: She is well-developed.  Pulmonary:     Effort: Pulmonary effort is normal.  Musculoskeletal:        General: Normal range of motion.  Skin:    General: Skin is warm.  Neurological:     Mental Status: She is alert.    Neurovascular status intact muscle strength found to be within normal limits with a nodule that is noted in the dorsal of the right foot midfoot that measures approximately 1.2 x 1 cm and appears to be within subcutaneous tissue.  It is quite sore when pressed with mild edema surrounding the area and patient has good digital perfusion well oriented x3     Assessment:  Possibility for a congealed hematoma or other pathology secondary to the injury sustained 2 months ago     Plan:  H&P x-rays reviewed condition discussed.  At this time I did a proximal nerve block I attempted to aspirate was not able to get fluid out and put a small amount of steroid under it to try to shrink the inflammation and did discuss possible resection of the mass which may be necessary in the future.  If on warm compresses and patient will be seen back as needed  X-rays are negative for signs of bony issues associated with this appears to be soft tissue

## 2021-08-12 ENCOUNTER — Other Ambulatory Visit: Payer: Self-pay | Admitting: Podiatry

## 2021-08-12 DIAGNOSIS — M6740 Ganglion, unspecified site: Secondary | ICD-10-CM

## 2021-09-08 ENCOUNTER — Encounter: Payer: Self-pay | Admitting: Emergency Medicine

## 2021-09-08 ENCOUNTER — Other Ambulatory Visit: Payer: Self-pay

## 2021-09-08 ENCOUNTER — Ambulatory Visit
Admission: EM | Admit: 2021-09-08 | Discharge: 2021-09-08 | Discharge status: Absent without leave | Payer: BC Managed Care – PPO

## 2021-09-08 ENCOUNTER — Ambulatory Visit
Admission: EM | Admit: 2021-09-08 | Discharge: 2021-09-08 | Disposition: A | Discharge status: Home / Self Care | Payer: BC Managed Care – PPO

## 2021-09-08 DIAGNOSIS — J0100 Acute maxillary sinusitis, unspecified: Secondary | ICD-10-CM | POA: Diagnosis not present

## 2021-09-08 MED ORDER — AMOXICILLIN 875 MG PO TABS: 875.0000 mg | ORAL_TABLET | Freq: Two times a day (BID) | ORAL | 0 refills | Status: AC

## 2021-09-08 NOTE — ED Triage Notes (Signed)
Pt presents with ST and head congestion x 12 days and her cough x 3 days.

## 2021-09-08 NOTE — Discharge Instructions (Addendum)
Take the amoxicillin as directed.  Follow up with your primary care provider if your symptoms are not improving.   ° ° °

## 2021-09-08 NOTE — ED Provider Notes (Signed)
[facility]    CSN: [id number] Arrival date & time: 09/08/21  1747      History   Chief Complaint Chief Complaint  Patient presents with   Sore Throat   Cough   Headache    HPI Holly Chung is a 51 y.o. female.  Patient presents with 1.5 week postnasal drip, sinus congestion, runny nose, sore throat.  She developed a cough 3 days ago.  She has been taking Mucinex and NyQuil for her symptoms.  No fever, rash, wheezing, shortness of breath, vomiting, diarrhea, or other symptoms.  History of Her medical history includes asthma, allergic rhinitis, migraine headaches, diverticulosis, GERD, IBS.  The history is provided by the patient and medical records.   Past Medical History:  Diagnosis Date   Allergic rhinitis, cause unspecified    Reactive airways   Anemia    Chicken pox    Contact dermatitis and other eczema due to plants (except food)    Depression    Diverticulosis    Esophageal reflux    Gastroparesis    GERD (gastroesophageal reflux disease)    Irritable bowel syndrome    Migraine    Obesity    Other malaise and fatigue    Personal history of unspecified urinary disorder    Predominant disturbance of emotions    Recurrent sinusitis    Unspecified asthma(493.90)    Unspecified constipation    Unspecified sinusitis (chronic)    Urinary tract infection, site not specified    Vitamin B12 deficiency     Patient Active Problem List   Diagnosis Date Noted   Anxiety and depression 06/24/2017   Acute upper respiratory infection 11/04/2015   Severe obesity (BMI >= 40) (HCC) 08/28/2015   Weight gain finding 08/27/2015   Vomiting 09/15/2011   Fatigue 12/21/2010   Headache(784.0) 12/21/2010   Numbness and tingling in left hand 12/06/2010   CONSTIPATION, INTERMITTENT 06/07/2010   GASTROPARESIS 11/25/2009   VITAMIN B12 DEFICIENCY 05/15/2009   STRESS REACTION, ACUTE, WITH EMOTIONAL DISTURBANCE 05/08/2009   ESOPHAGEAL REFLUX 05/08/2009   FATIGUE 05/08/2009    SINUSITIS, RECURRENT 02/13/2008   VASOMOTOR RHINITIS 02/13/2008   REACTIVE AIRWAY DISEASE 02/13/2008   IBS 02/13/2008   UTI'S, HX OF 02/13/2008   CONTACT DERMATITIS&OTHER ECZEMA DUE TO PLANTS 01/31/2008    Past Surgical History:  Procedure Laterality Date   INCONTINENCE SURGERY  3/10   LUMBAR DISC SURGERY  06/2003   L5-S1   TONSILLECTOMY      OB History   No obstetric history on file.      Home Medications    Prior to Admission medications   Medication Sig Start Date End Date Taking? Authorizing Provider  amoxicillin (AMOXIL) 875 MG tablet Take 1 tablet (875 mg total) by mouth 2 (two) times daily for 7 days. 09/08/21 09/15/21 Yes Mickie Bailate, Imoni Kohen H, NP  buPROPion (WELLBUTRIN SR) 150 MG 12 hr tablet Take 1 tablet by mouth 2 times daily. Please make an appointment for more refills. 01/30/18  Yes Evaristo BuryShambley, Ashleigh N, NP  pantoprazole (PROTONIX) 20 MG tablet Take 1 tablet (20 mg total) daily by mouth. 07/17/17  Yes Elson AreasSofia, Leslie K, PA-C  Biotin 1 MG CAPS biotin    [provider]  ibuprofen (ADVIL,MOTRIN) 200 MG tablet Take 800 mg every 6 (six) hours as needed by mouth for fever, headache, mild pain, moderate pain or cramping.    [provider]  SUMAtriptan (IMITREX) 100 MG tablet sumatriptan 100 mg tablet    [provider]  vitamin B-12 (CYANOCOBALAMIN) 100 MCG tablet Vitamin B12    [provider]  zonisamide (ZONEGRAN) 100 MG capsule zonisamide 100 mg capsule    [provider]    Family History Family History  Problem Relation Age of Onset   Emphysema Father    Heart disease Father    Hypertension Father    Leukemia Brother         acute myeloid lymphoma   Brain cancer Brother    Rheum arthritis Mother    Cancer Maternal Grandfather        unsure if kidney or liver   Heart disease Paternal Grandmother    Emphysema Paternal Grandmother    Colon cancer Maternal Aunt     Social History Social History   Tobacco Use    Smoking status: Former    Packs/day: 0.50    Years: 15.00    Pack years: 7.50    Types: Cigarettes    Quit date: 09/06/1999    Years since quitting: 22.0   Smokeless tobacco: Never  Vaping Use   Vaping Use: Never used  Substance Use Topics   Alcohol use: Yes    Comment: occasionally   Drug use: No     Allergies   Aleve [naproxen] and Naproxen sodium   Review of Systems Review of Systems  Constitutional:  Negative for chills and fever.  HENT:  Positive for congestion, postnasal drip, rhinorrhea, sinus pressure and sore throat. Negative for ear pain.   Respiratory:  Positive for cough. Negative for shortness of breath and wheezing.   Cardiovascular:  Negative for chest pain and palpitations.  Gastrointestinal:  Negative for diarrhea and vomiting.  Skin:  Negative for color change and rash.  All other systems reviewed and are negative.   Physical Exam Triage Vital Signs ED Triage Vitals  Enc Vitals Group     BP 09/08/21 1813 106/77     Pulse Rate 09/08/21 1813 90     Resp 09/08/21 1813 18     Temp 09/08/21 1813 98.2 F (36.8 C)     Temp Source 09/08/21 1813 Oral     SpO2 09/08/21 1813 96 %     Weight --      Height --      Head Circumference --      Peak Flow --      Pain Score 09/08/21 1815 0     Pain Loc --      Pain Edu? --      Excl. in GC? --    No data found.  Updated Vital Signs BP 106/77 (BP Location: Left Arm)    Pulse 90    Temp 98.2 F (36.8 C) (Oral)    Resp 18    SpO2 96%   Visual Acuity Right Eye Distance:   Left Eye Distance:   Bilateral Distance:    Right Eye Near:   Left Eye Near:    Bilateral Near:     Physical Exam Vitals and nursing note reviewed.  Constitutional:      General: She is not in acute distress.    Appearance: She is well-developed. She is not ill-appearing.  HENT:     Right Ear: Tympanic membrane normal.     Left Ear: Tympanic membrane normal.     Nose: Congestion and rhinorrhea present.     Mouth/Throat:      Mouth: Mucous membranes are moist.     Pharynx: Oropharynx is clear.  Cardiovascular:  Rate and Rhythm: Normal rate and regular rhythm.     Heart sounds: Normal heart sounds.  Pulmonary:     Effort: Pulmonary effort is normal. No respiratory distress.     Breath sounds: Normal breath sounds.  Musculoskeletal:     Cervical back: Neck supple.  Skin:    General: Skin is warm and dry.  Neurological:     Mental Status: She is alert.  Psychiatric:        Mood and Affect: Mood normal.        Behavior: Behavior normal.     UC Treatments / Results  Labs (all labs ordered are listed, but only abnormal results are displayed) Labs Reviewed - No data to display  EKG   Radiology No results found.  Procedures Procedures (including critical care time)  Medications Ordered in UC Medications - No data to display  Initial Impression / Assessment and Plan / UC Course  I have reviewed the triage vital signs and the nursing notes.  Pertinent labs & imaging results that were available during my care of the patient were reviewed by me and considered in my medical decision making (see chart for details).    Acute sinusitis.  Patient has been symptomatic for 1.5 weeks and is getting worse.  No improvement with OTC treatment.  Treating today with amoxicillin.  Discussed plain Mucinex for her symptoms.  Instructed her to follow-up with her PCP if she is not improving.  She agrees to plan of care.   Final Clinical Impressions(s) / UC Diagnoses   Final diagnoses:  Acute non-recurrent maxillary sinusitis     Discharge Instructions      Take the amoxicillin as directed.  Follow up with your primary care provider if your symptoms are not improving.        ED Prescriptions     Medication Sig Dispense Auth. Provider   amoxicillin (AMOXIL) 875 MG tablet Take 1 tablet (875 mg total) by mouth 2 (two) times daily for 7 days. 14 tablet Mickie Bail, NP      PDMP not reviewed this  encounter.   Mickie Bail, NP 09/08/21 [phone]

## 2021-09-14 DIAGNOSIS — J209 Acute bronchitis, unspecified: Secondary | ICD-10-CM | POA: Diagnosis not present

## 2021-09-14 DIAGNOSIS — R062 Wheezing: Secondary | ICD-10-CM | POA: Diagnosis not present

## 2021-09-29 DIAGNOSIS — R945 Abnormal results of liver function studies: Secondary | ICD-10-CM | POA: Diagnosis not present

## 2021-10-28 DIAGNOSIS — R112 Nausea with vomiting, unspecified: Secondary | ICD-10-CM | POA: Diagnosis not present

## 2021-10-28 DIAGNOSIS — U071 COVID-19: Secondary | ICD-10-CM | POA: Diagnosis not present

## 2022-01-17 DIAGNOSIS — H18413 Arcus senilis, bilateral: Secondary | ICD-10-CM | POA: Diagnosis not present

## 2022-01-17 DIAGNOSIS — H2513 Age-related nuclear cataract, bilateral: Secondary | ICD-10-CM | POA: Diagnosis not present

## 2022-01-17 DIAGNOSIS — H5203 Hypermetropia, bilateral: Secondary | ICD-10-CM | POA: Diagnosis not present

## 2022-02-22 DIAGNOSIS — H5201 Hypermetropia, right eye: Secondary | ICD-10-CM | POA: Diagnosis not present

## 2022-02-22 DIAGNOSIS — H2512 Age-related nuclear cataract, left eye: Secondary | ICD-10-CM | POA: Diagnosis not present

## 2022-02-22 DIAGNOSIS — F419 Anxiety disorder, unspecified: Secondary | ICD-10-CM | POA: Diagnosis not present

## 2022-06-16 DIAGNOSIS — M1612 Unilateral primary osteoarthritis, left hip: Secondary | ICD-10-CM | POA: Diagnosis not present

## 2022-06-16 DIAGNOSIS — M545 Low back pain, unspecified: Secondary | ICD-10-CM | POA: Diagnosis not present

## 2022-06-24 DIAGNOSIS — M1612 Unilateral primary osteoarthritis, left hip: Secondary | ICD-10-CM | POA: Diagnosis not present

## 2022-07-11 DIAGNOSIS — F419 Anxiety disorder, unspecified: Secondary | ICD-10-CM | POA: Diagnosis not present

## 2022-07-11 DIAGNOSIS — K219 Gastro-esophageal reflux disease without esophagitis: Secondary | ICD-10-CM | POA: Diagnosis not present

## 2022-08-02 DIAGNOSIS — M1612 Unilateral primary osteoarthritis, left hip: Secondary | ICD-10-CM | POA: Diagnosis not present

## 2022-10-20 DIAGNOSIS — Z6837 Body mass index (BMI) 37.0-37.9, adult: Secondary | ICD-10-CM | POA: Diagnosis not present

## 2022-10-20 DIAGNOSIS — Z1231 Encounter for screening mammogram for malignant neoplasm of breast: Secondary | ICD-10-CM | POA: Diagnosis not present

## 2022-10-20 DIAGNOSIS — Z124 Encounter for screening for malignant neoplasm of cervix: Secondary | ICD-10-CM | POA: Diagnosis not present

## 2022-10-20 DIAGNOSIS — Z01419 Encounter for gynecological examination (general) (routine) without abnormal findings: Secondary | ICD-10-CM | POA: Diagnosis not present

## 2022-10-26 DIAGNOSIS — J101 Influenza due to other identified influenza virus with other respiratory manifestations: Secondary | ICD-10-CM | POA: Diagnosis not present

## 2022-10-26 DIAGNOSIS — R051 Acute cough: Secondary | ICD-10-CM | POA: Diagnosis not present

## 2022-10-26 DIAGNOSIS — Z03818 Encounter for observation for suspected exposure to other biological agents ruled out: Secondary | ICD-10-CM | POA: Diagnosis not present

## 2023-01-17 DIAGNOSIS — E669 Obesity, unspecified: Secondary | ICD-10-CM | POA: Diagnosis not present

## 2023-01-17 DIAGNOSIS — H9190 Unspecified hearing loss, unspecified ear: Secondary | ICD-10-CM | POA: Diagnosis not present

## 2023-01-17 DIAGNOSIS — Z0000 Encounter for general adult medical examination without abnormal findings: Secondary | ICD-10-CM | POA: Diagnosis not present

## 2023-01-17 DIAGNOSIS — Z6837 Body mass index (BMI) 37.0-37.9, adult: Secondary | ICD-10-CM | POA: Diagnosis not present

## 2023-01-17 DIAGNOSIS — Z13220 Encounter for screening for lipoid disorders: Secondary | ICD-10-CM | POA: Diagnosis not present

## 2023-01-17 DIAGNOSIS — F419 Anxiety disorder, unspecified: Secondary | ICD-10-CM | POA: Diagnosis not present

## 2023-02-16 DIAGNOSIS — H903 Sensorineural hearing loss, bilateral: Secondary | ICD-10-CM | POA: Diagnosis not present

## 2023-05-13 DIAGNOSIS — M7541 Impingement syndrome of right shoulder: Secondary | ICD-10-CM | POA: Diagnosis not present

## 2023-05-13 DIAGNOSIS — M542 Cervicalgia: Secondary | ICD-10-CM | POA: Diagnosis not present

## 2023-08-27 DIAGNOSIS — J0100 Acute maxillary sinusitis, unspecified: Secondary | ICD-10-CM | POA: Diagnosis not present

## 2023-11-21 DIAGNOSIS — L821 Other seborrheic keratosis: Secondary | ICD-10-CM | POA: Diagnosis not present

## 2023-11-21 DIAGNOSIS — L814 Other melanin hyperpigmentation: Secondary | ICD-10-CM | POA: Diagnosis not present

## 2023-11-21 DIAGNOSIS — L720 Epidermal cyst: Secondary | ICD-10-CM | POA: Diagnosis not present

## 2023-11-21 DIAGNOSIS — D2372 Other benign neoplasm of skin of left lower limb, including hip: Secondary | ICD-10-CM | POA: Diagnosis not present

## 2023-12-05 DIAGNOSIS — J019 Acute sinusitis, unspecified: Secondary | ICD-10-CM | POA: Diagnosis not present

## 2023-12-21 DIAGNOSIS — E669 Obesity, unspecified: Secondary | ICD-10-CM | POA: Diagnosis not present

## 2023-12-21 DIAGNOSIS — M79642 Pain in left hand: Secondary | ICD-10-CM | POA: Diagnosis not present

## 2023-12-21 DIAGNOSIS — M79641 Pain in right hand: Secondary | ICD-10-CM | POA: Diagnosis not present

## 2024-01-10 DIAGNOSIS — E8889 Other specified metabolic disorders: Secondary | ICD-10-CM | POA: Diagnosis not present

## 2024-01-10 DIAGNOSIS — R7309 Other abnormal glucose: Secondary | ICD-10-CM | POA: Diagnosis not present

## 2024-01-10 DIAGNOSIS — Z1331 Encounter for screening for depression: Secondary | ICD-10-CM | POA: Diagnosis not present

## 2024-01-10 DIAGNOSIS — R5383 Other fatigue: Secondary | ICD-10-CM | POA: Diagnosis not present

## 2024-01-10 DIAGNOSIS — E559 Vitamin D deficiency, unspecified: Secondary | ICD-10-CM | POA: Diagnosis not present

## 2024-01-10 DIAGNOSIS — Z79899 Other long term (current) drug therapy: Secondary | ICD-10-CM | POA: Diagnosis not present

## 2024-01-10 DIAGNOSIS — Z6839 Body mass index (BMI) 39.0-39.9, adult: Secondary | ICD-10-CM | POA: Diagnosis not present

## 2024-01-10 DIAGNOSIS — R7303 Prediabetes: Secondary | ICD-10-CM | POA: Diagnosis not present

## 2024-02-06 DIAGNOSIS — E8889 Other specified metabolic disorders: Secondary | ICD-10-CM | POA: Diagnosis not present

## 2024-02-06 DIAGNOSIS — R7303 Prediabetes: Secondary | ICD-10-CM | POA: Diagnosis not present

## 2024-02-06 DIAGNOSIS — E559 Vitamin D deficiency, unspecified: Secondary | ICD-10-CM | POA: Diagnosis not present

## 2024-02-20 DIAGNOSIS — Z0000 Encounter for general adult medical examination without abnormal findings: Secondary | ICD-10-CM | POA: Diagnosis not present

## 2024-02-20 DIAGNOSIS — E669 Obesity, unspecified: Secondary | ICD-10-CM | POA: Diagnosis not present

## 2024-02-20 DIAGNOSIS — R7303 Prediabetes: Secondary | ICD-10-CM | POA: Diagnosis not present

## 2024-02-20 DIAGNOSIS — E559 Vitamin D deficiency, unspecified: Secondary | ICD-10-CM | POA: Diagnosis not present

## 2024-02-20 DIAGNOSIS — F419 Anxiety disorder, unspecified: Secondary | ICD-10-CM | POA: Diagnosis not present

## 2024-03-04 DIAGNOSIS — R7303 Prediabetes: Secondary | ICD-10-CM | POA: Diagnosis not present

## 2024-04-01 DIAGNOSIS — M19041 Primary osteoarthritis, right hand: Secondary | ICD-10-CM | POA: Diagnosis not present

## 2024-04-01 DIAGNOSIS — R768 Other specified abnormal immunological findings in serum: Secondary | ICD-10-CM | POA: Diagnosis not present

## 2024-04-01 DIAGNOSIS — M180 Bilateral primary osteoarthritis of first carpometacarpal joints: Secondary | ICD-10-CM | POA: Diagnosis not present

## 2024-04-01 DIAGNOSIS — M25542 Pain in joints of left hand: Secondary | ICD-10-CM | POA: Diagnosis not present

## 2024-04-01 DIAGNOSIS — M25541 Pain in joints of right hand: Secondary | ICD-10-CM | POA: Diagnosis not present

## 2024-04-11 ENCOUNTER — Encounter: Payer: Self-pay | Admitting: Podiatry

## 2024-04-11 ENCOUNTER — Ambulatory Visit: Admitting: Podiatry

## 2024-04-11 ENCOUNTER — Ambulatory Visit (INDEPENDENT_AMBULATORY_CARE_PROVIDER_SITE_OTHER): Admitting: Podiatry

## 2024-04-11 ENCOUNTER — Ambulatory Visit (INDEPENDENT_AMBULATORY_CARE_PROVIDER_SITE_OTHER)

## 2024-04-11 DIAGNOSIS — S9032XA Contusion of left foot, initial encounter: Secondary | ICD-10-CM

## 2024-04-11 DIAGNOSIS — M84374A Stress fracture, right foot, initial encounter for fracture: Secondary | ICD-10-CM

## 2024-04-11 MED ORDER — DICLOFENAC SODIUM 75 MG PO TBEC
75.0000 mg | DELAYED_RELEASE_TABLET | Freq: Two times a day (BID) | ORAL | 2 refills | Status: AC
Start: 2024-04-11 — End: ?

## 2024-04-11 NOTE — Progress Notes (Signed)
 Subjective:   Patient ID: Holly Chung, female   DOB: 53 y.o.   MRN: [id number]   HPI Patient presents stating she is developed severe pain in her left heel and it is excruciating and very hard to walk on.  Stated it started yesterday and she also had pain in the outside of the foot that is tolerable but this has been awful   ROS      Objective:  Physical Exam  Neuro vascular status found to be intact intense discomfort in the left calcaneus which appears to be more within the body of the calcaneus versus the plantar fascia very sore when pressed very difficult to walk     Assessment:  Appears to be stress fracture of the left calcaneus at this time     Plan:  H&P reviewed and reviewed x-ray.  At this point I applied air fracture walker I advised on ice I placed on oral diclofenac  twice daily and explained how to use it properly and reappoint in 4 weeks or earlier if needed  X-rays indicate that there is more subtle signs of possible stress fracture of the calcaneus very difficult to make complete determination at this time

## 2024-04-22 DIAGNOSIS — R768 Other specified abnormal immunological findings in serum: Secondary | ICD-10-CM | POA: Diagnosis not present

## 2024-04-22 DIAGNOSIS — M25542 Pain in joints of left hand: Secondary | ICD-10-CM | POA: Diagnosis not present

## 2024-04-22 DIAGNOSIS — M25541 Pain in joints of right hand: Secondary | ICD-10-CM | POA: Diagnosis not present

## 2024-04-29 DIAGNOSIS — R7303 Prediabetes: Secondary | ICD-10-CM | POA: Diagnosis not present

## 2024-05-02 ENCOUNTER — Encounter: Payer: Self-pay | Admitting: Podiatry

## 2024-05-02 ENCOUNTER — Ambulatory Visit (INDEPENDENT_AMBULATORY_CARE_PROVIDER_SITE_OTHER)

## 2024-05-02 ENCOUNTER — Ambulatory Visit (INDEPENDENT_AMBULATORY_CARE_PROVIDER_SITE_OTHER): Admitting: Podiatry

## 2024-05-02 DIAGNOSIS — M84375A Stress fracture, left foot, initial encounter for fracture: Secondary | ICD-10-CM | POA: Diagnosis not present

## 2024-05-02 DIAGNOSIS — M84374A Stress fracture, right foot, initial encounter for fracture: Secondary | ICD-10-CM

## 2024-05-02 NOTE — Progress Notes (Signed)
 Subjective:   Patient ID: Stephane NOVAK Carlberg, female   DOB: 53 y.o.   MRN: [id number]   HPI Patient states that she is improved from her left heel pain but it does seem better but she Spoon wearing her boot most of the time   ROS      Objective:  Physical Exam  Neurovascular status intact with discomfort in the lateral side of the calcaneus left and mild in the plantar lateral portion of the calcaneus.  Mild swelling of the area negative edema noted     Assessment:  Probability for stress fracture of the left calcaneus     Plan:  H&P reviewed at this point I did discuss the stress fracture I placed on oral anti-inflammatory giving her instructions on diclofenac  and I want her to continue boot but gradually be use it over the next 2 to 4 weeks.  All questions answered today  X-rays indicated that there is a possibility for stress fracture of the more plantar proximal portion of the calcaneus hard to make complete determination currently

## 2024-06-19 ENCOUNTER — Encounter: Payer: Self-pay | Admitting: Podiatry

## 2024-06-19 ENCOUNTER — Ambulatory Visit

## 2024-06-19 ENCOUNTER — Ambulatory Visit (INDEPENDENT_AMBULATORY_CARE_PROVIDER_SITE_OTHER): Admitting: Podiatry

## 2024-06-19 DIAGNOSIS — S9032XA Contusion of left foot, initial encounter: Secondary | ICD-10-CM

## 2024-06-19 DIAGNOSIS — M722 Plantar fascial fibromatosis: Secondary | ICD-10-CM

## 2024-06-19 MED ORDER — DICLOFENAC SODIUM 75 MG PO TBEC: 75.0000 mg | DELAYED_RELEASE_TABLET | Freq: Two times a day (BID) | ORAL | 2 refills | Status: AC

## 2024-06-19 MED ORDER — TRIAMCINOLONE ACETONIDE 10 MG/ML IJ SUSP
10.0000 mg | Freq: Once | INTRAMUSCULAR | Status: AC
  Administered 2024-06-19: 10 mg via INTRA_ARTICULAR

## 2024-06-19 NOTE — Progress Notes (Signed)
 Subjective:   Patient ID: Holly Chung, female   DOB: 53 y.o.   MRN: [id number]   HPI Patient presents stating that she is [place] having a lot of pain but it seems to be more now on the bottom than the back and she is really not able to walk and states also the back of the heel gets sore at times neuro   ROS      Objective:  Physical Exam  Vascular status found to be intact with patient found to have pain that is more inflammation plantar lateral than it is within the calcaneal body or posterior.  There Crew is mild to moderate discomfort in this area but not to the same degree     Assessment:  Inflammatory fasciitis plantar lateral heel and possibility of a stress fracture which at this point should be healed     Plan:  H&P all conditions reviewed.  At this point I am going to focus more on the inflammation than the calcaneal body and I did rex-ray then went ahead and did a plantar lateral injection 3 mg dexamethasone Kenalog 5 mg Xylocaine.  I dispensed night splint that I want her to use for approximate 3 to 20-minute periods during the day and then sleep and depending on the amount of inflammation ice packs and Ace wrap.  Reappoint in 4 weeks or earlier if needed  X-rays indicate that there is plantar spur difficult to make determination of stress fracture

## 2024-07-15 DIAGNOSIS — E6609 Other obesity due to excess calories: Secondary | ICD-10-CM | POA: Diagnosis not present

## 2024-07-15 DIAGNOSIS — Z6836 Body mass index (BMI) 36.0-36.9, adult: Secondary | ICD-10-CM | POA: Diagnosis not present

## 2024-07-15 DIAGNOSIS — R7303 Prediabetes: Secondary | ICD-10-CM | POA: Diagnosis not present

## 2024-07-15 DIAGNOSIS — E66812 Obesity, class 2: Secondary | ICD-10-CM | POA: Diagnosis not present

## 2024-07-22 ENCOUNTER — Ambulatory Visit (INDEPENDENT_AMBULATORY_CARE_PROVIDER_SITE_OTHER): Admitting: Podiatry

## 2024-07-22 ENCOUNTER — Encounter: Payer: Self-pay | Admitting: Podiatry

## 2024-07-22 DIAGNOSIS — M7662 Achilles tendinitis, left leg: Secondary | ICD-10-CM

## 2024-07-22 MED ORDER — TRIAMCINOLONE ACETONIDE 10 MG/ML IJ SUSP
10.0000 mg | Freq: Once | INTRAMUSCULAR | Status: AC
  Administered 2024-07-22: 10 mg via INTRA_ARTICULAR

## 2024-07-22 NOTE — Progress Notes (Signed)
 Subjective:   Patient ID: Holly Chung, female   DOB: 53 y.o.   MRN: [id number]   HPI Patient states the bottom of the heels been doing really well but she has 1 small spot on the back of the left heel that is been very tender for her and feels like if she can get this better that she be in good shape   ROS      Objective:  Physical Exam  Neurovascular status intact with inflammation around the Achilles tendon insertion on the medial side with the central lateral tendon doing well     Assessment:  Achilles tendinitis left with plantar fasciitis doing much better     Plan:  H&P reviewed I have recommended very careful injection explained procedure risk and she wants to do this understanding risk of rupture.  Sterile prep and carefully injected the medial side 2 mg dexamethasone Kenalog 5 mg Xylocaine and applied sterile dressing

## 2024-08-21 DIAGNOSIS — R0981 Nasal congestion: Secondary | ICD-10-CM | POA: Diagnosis not present

## 2024-08-21 DIAGNOSIS — R051 Acute cough: Secondary | ICD-10-CM | POA: Diagnosis not present

## 2024-08-21 DIAGNOSIS — J060 Acute laryngopharyngitis: Secondary | ICD-10-CM | POA: Diagnosis not present

## 2024-08-21 DIAGNOSIS — R0982 Postnasal drip: Secondary | ICD-10-CM | POA: Diagnosis not present

## 2024-09-02 DIAGNOSIS — R7303 Prediabetes: Secondary | ICD-10-CM | POA: Diagnosis not present

## 2024-09-02 DIAGNOSIS — F419 Anxiety disorder, unspecified: Secondary | ICD-10-CM | POA: Diagnosis not present

## 2024-09-02 DIAGNOSIS — E669 Obesity, unspecified: Secondary | ICD-10-CM | POA: Diagnosis not present
